# Patient Record
Sex: Male | Born: 2000 | Race: Black or African American | Hispanic: No | Marital: Single | Smoking: Never smoker
Health system: Southern US, Community
[De-identification: ages and names within clinical notes are randomized; demographics above are authoritative.]

## PROBLEM LIST (undated history)

## (undated) DIAGNOSIS — F39 Unspecified mood [affective] disorder: Secondary | ICD-10-CM

## (undated) DIAGNOSIS — J45909 Unspecified asthma, uncomplicated: Secondary | ICD-10-CM

## (undated) HISTORY — PX: ADENOIDECTOMY: SUR15

## (undated) HISTORY — PX: TONSILLECTOMY: SUR1361

---
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
## 2021-03-10 ENCOUNTER — Encounter (HOSPITAL_COMMUNITY): Payer: Self-pay

## 2021-03-10 ENCOUNTER — Ambulatory Visit (HOSPITAL_COMMUNITY)
Admission: EM | Admit: 2021-03-10 | Discharge: 2021-03-10 | Disposition: A | Discharge status: Home / Self Care | Payer: Medicaid Other | Attending: Physician Assistant | Admitting: Physician Assistant

## 2021-03-10 ENCOUNTER — Other Ambulatory Visit: Payer: Self-pay

## 2021-03-10 DIAGNOSIS — M546 Pain in thoracic spine: Secondary | ICD-10-CM | POA: Diagnosis not present

## 2021-03-10 DIAGNOSIS — W19XXXA Unspecified fall, initial encounter: Secondary | ICD-10-CM | POA: Diagnosis not present

## 2021-03-10 DIAGNOSIS — R0781 Pleurodynia: Secondary | ICD-10-CM | POA: Diagnosis not present

## 2021-03-10 MED ORDER — TIZANIDINE HCL 4 MG PO CAPS: 4.0000 mg | ORAL_CAPSULE | Freq: Three times a day (TID) | ORAL | 0 refills | Status: DC

## 2021-03-10 MED ORDER — NAPROXEN 500 MG PO TABS: 500.0000 mg | ORAL_TABLET | Freq: Two times a day (BID) | ORAL | 0 refills | Status: DC

## 2021-03-10 NOTE — Discharge Instructions (Signed)
Take Naprosyn twice daily with food.  You should not take NSAIDs (ibuprofen/Advil, naproxen/Aleve, aspirin) with this medication due to risk of GI bleeding.  Take Zanaflex up to 3 times a day as needed for muscle pain.  This medication can make you sleepy so do not drive or drink alcohol while taking it.  You can use heat and stretch for symptom relief.  If you have persistent or worsening symptoms please return for reevaluation.

## 2021-03-10 NOTE — ED Provider Notes (Signed)
[facility]    CSN: [id number] Arrival date & time: 03/10/21  1647      History   Chief Complaint Chief Complaint  Patient presents with  . Fall  . Back Pain    HPI Jason Fuller is a 20 y.o. male.   Patient presents today for evaluation of back and rib pain following injury yesterday.  Reports that he went to dunk a ball when he twisted wrong and fell.  He did not impact his rib/back but felt an unusual sensation with a twisting motion.  Since that time he has had persistent pain which is currently rated 3 on a surgical pain scale but increases to 8 at night, localized to right thoracic back with radiation laterally to ribs, described as aching any factors identified.  He has tried Tylenol without improvement of symptoms.  Denies any numbness or paresthesias.  Denies any shortness of breath, chest pain, nausea, vomiting.  He denied his head with the fall and denies any loss of consciousness.  He was able to move out of his apartment and has been able to do physical activities despite symptoms.  No additional complaints or concerns today.     Past Medical History:  Diagnosis Date  . Asthma   . Attention deficit disorder   . Mood disorder [facility])     Patient Active Problem List   Diagnosis Date Noted  . Attention deficit disorder     Past Surgical History:  Procedure Laterality Date  . ADENOIDECTOMY    . TONSILLECTOMY         Home Medications    Prior to Admission medications   Medication Sig Start Date End Date Taking? Authorizing Provider  naproxen (NAPROSYN) 500 MG tablet Take 1 tablet (500 mg total) by mouth 2 (two) times daily. 03/10/21  Yes Kholton Coate K, PA-C  tiZANidine (ZANAFLEX) 4 MG capsule Take 1 capsule (4 mg total) by mouth 3 (three) times daily. 03/10/21  Yes Marshe Shrestha, Noberto Retort, PA-C  acetaminophen (TYLENOL) 325 MG tablet Take 650 mg by mouth every 6 (six) hours as needed for mild pain.    [provider]  cetirizine (ZYRTEC) 10 MG tablet  Take 1 tablet (10 mg total) by mouth at bedtime. 11/28/19   Lowanda Foster, NP  fluticasone (FLONASE) 50 MCG/ACT nasal spray Place 2 sprays into both nostrils daily. Patient not taking: Reported on 08/19/2018 10/20/13   Ivonne Andrew, PA-C  guaiFENesin (MUCINEX) 600 MG 12 hr tablet Take 1 tablet (600 mg total) by mouth 2 (two) times daily. Patient not taking: Reported on 08/19/2018 10/20/13   Ivonne Andrew, PA-C    Family History History reviewed. No pertinent family history.  Social History Social History   Tobacco Use  . Smoking status: Never Smoker  . Smokeless tobacco: Never Used     Allergies   Patient has no known allergies.   Review of Systems Review of Systems  Constitutional: Positive for activity change. Negative for appetite change, fatigue and fever.  Respiratory: Negative for cough and shortness of breath.   Cardiovascular: Negative for chest pain.  Gastrointestinal: Negative for abdominal pain, diarrhea, nausea and vomiting.  Musculoskeletal: Positive for back pain. Negative for arthralgias and myalgias.  Neurological: Negative for dizziness, light-headedness and headaches.     Physical Exam Triage Vital Signs ED Triage Vitals  Enc Vitals Group     BP 03/10/21 1751 136/69     Pulse Rate 03/10/21 1751 (!) 54     Resp 03/10/21 1751  18     Temp 03/10/21 1751 98.2 F (36.8 C)     Temp src --      SpO2 03/10/21 1751 98 %     Weight --      Height --      Head Circumference --      Peak Flow --      Pain Score 03/10/21 1749 7     Pain Loc --      Pain Edu? --      Excl. in GC? --    No data found.  Updated Vital Signs BP 136/69   Pulse (!) 54   Temp 98.2 F (36.8 C)   Resp 18   SpO2 98%   Visual Acuity Right Eye Distance:   Left Eye Distance:   Bilateral Distance:    Right Eye Near:   Left Eye Near:    Bilateral Near:     Physical Exam Vitals reviewed.  Constitutional:      General: He is awake.     Appearance: Normal appearance. He  is normal weight. He is not ill-appearing.     Comments: Very pleasant male appears stated age in no acute distress  HENT:     Head: Normocephalic and atraumatic.     Mouth/Throat:     Pharynx: No oropharyngeal exudate, posterior oropharyngeal erythema or uvula swelling.  Cardiovascular:     Rate and Rhythm: Normal rate and regular rhythm.     Heart sounds: No murmur heard.   Pulmonary:     Effort: Pulmonary effort is normal.     Breath sounds: Normal breath sounds. No stridor. No wheezing, rhonchi or rales.     Comments: Clear to auscultation bilaterally Abdominal:     Palpations: Abdomen is soft.     Tenderness: There is no abdominal tenderness.  Musculoskeletal:     Cervical back: No tenderness or bony tenderness.     Thoracic back: Tenderness present. No bony tenderness.     Lumbar back: No tenderness or bony tenderness. Negative right straight leg raise test and negative left straight leg raise test.     Comments: Back: No pain percussion of vertebrae.  Tenderness palpation of right thoracic paraspinal muscles and right lateral ribs.  No spasms noted.  Negative straight leg raise bilaterally.  Neurological:     Mental Status: He is alert.  Psychiatric:        Behavior: Behavior is cooperative.      UC Treatments / Results  Labs (all labs ordered are listed, but only abnormal results are displayed) Labs Reviewed - No data to display  EKG   Radiology No results found.  Procedures Procedures (including critical care time)  Medications Ordered in UC Medications - No data to display  Initial Impression / Assessment and Plan / UC Course  I have reviewed the triage vital signs and the nursing notes.  Pertinent labs & imaging results that were available during my care of the patient were reviewed by me and considered in my medical decision making (see chart for details).     No bony tenderness or deformity that would warrant plain films.  Suspect muscle strain  given clinical presentation.  Patient started on Naprosyn with instruction not to take NSAIDs with this medication due to risk of GI bleeding.  He was prescribed Zanaflex to be used up to 3 times a day with instruction not to drive or drink alcohol with this medication as drowsiness is a common side effect.  He was given work excuse note.  Recommended heat and rest and stretch for symptom relief.  Strict return precautions given to which patient expressed understanding.  Final Clinical Impressions(s) / UC Diagnoses   Final diagnoses:  Acute right-sided thoracic back pain  Rib pain on right side  Fall, initial encounter     Discharge Instructions     Take Naprosyn twice daily with food.  You should not take NSAIDs (ibuprofen/Advil, naproxen/Aleve, aspirin) with this medication due to risk of GI bleeding.  Take Zanaflex up to 3 times a day as needed for muscle pain.  This medication can make you sleepy so do not drive or drink alcohol while taking it.  You can use heat and stretch for symptom relief.  If you have persistent or worsening symptoms please return for reevaluation.    ED Prescriptions    Medication Sig Dispense Auth. Provider   naproxen (NAPROSYN) 500 MG tablet Take 1 tablet (500 mg total) by mouth 2 (two) times daily. 30 tablet Blayne Garlick K, PA-C   tiZANidine (ZANAFLEX) 4 MG capsule Take 1 capsule (4 mg total) by mouth 3 (three) times daily. 21 capsule Vasiliki Smaldone K, PA-C     PDMP not reviewed this encounter.   Jeani Hawking, PA-C 03/10/21 1850

## 2021-03-10 NOTE — ED Triage Notes (Signed)
Pt in with c/o right upper back pain that started yesterday after he fell while playing basketball   Pt states his back did not hit the ground but he twisted wrong when he fell  Pt took tylenol for pain

## 2021-07-23 ENCOUNTER — Ambulatory Visit (HOSPITAL_COMMUNITY)
Admission: EM | Admit: 2021-07-23 | Discharge: 2021-07-23 | Disposition: A | Discharge status: Home / Self Care | Payer: Medicaid Other | Attending: Emergency Medicine | Admitting: Emergency Medicine

## 2021-07-23 ENCOUNTER — Encounter (HOSPITAL_COMMUNITY): Payer: Self-pay

## 2021-07-23 ENCOUNTER — Ambulatory Visit (INDEPENDENT_AMBULATORY_CARE_PROVIDER_SITE_OTHER): Payer: Medicaid Other

## 2021-07-23 DIAGNOSIS — Y9352 Activity, horseback riding: Secondary | ICD-10-CM | POA: Diagnosis not present

## 2021-07-23 DIAGNOSIS — J069 Acute upper respiratory infection, unspecified: Secondary | ICD-10-CM | POA: Insufficient documentation

## 2021-07-23 DIAGNOSIS — M25561 Pain in right knee: Secondary | ICD-10-CM | POA: Insufficient documentation

## 2021-07-23 LAB — POCT RAPID STREP A, ED / UC: Streptococcus, Group A Screen (Direct): NEGATIVE

## 2021-07-23 NOTE — Discharge Instructions (Signed)
  You may take 500mg  acetaminophen every 4-6 hours or in combination with ibuprofen 400-600mg  every 6-8 hours as needed for pain, inflammation, and fever.  Be sure to well hydrated with clear liquids and get at least 8 hours of sleep at night, preferably more while sick.   Please follow up with family medicine in 1 week if needed.   You may use the knee brace for comfort, elevate your leg 2-3 times daily to apply a cool compress. This will help with pain and inflammation. Call to schedule a follow up appointment with Sports Medicine later this week as needed.

## 2021-07-23 NOTE — ED Provider Notes (Signed)
[facility]    CSN: [id number] Arrival date & time: 07/23/21  1455      History   Chief Complaint Chief Complaint  Patient presents with   URI   Knee Injury    HPI Jason Fuller is a 20 y.o. male.   HPI Jason Fuller is a 20 y.o. male presenting to UC with c/o sore throat and non-productive cough with chills for 6 days, following a day at the fair with some friends. His friends, who are also roommates, also got sick but no known exposure to COVID or flu.  Denies fever, n/v/d. No chest pain or SOB. He is able to eat and drink well.  Pt also c/o Right knee pain that is aching and sore at the top and back of his knee. Pain started yesterday, fell off a horse yesterday and landed with his kneed straight.  Pain is 6/10, worse with flexion.     Past Medical History:  Diagnosis Date   Asthma    Attention deficit disorder    Mood disorder [facility])     Patient Active Problem List   Diagnosis Date Noted   Attention deficit disorder     Past Surgical History:  Procedure Laterality Date   ADENOIDECTOMY     TONSILLECTOMY         Home Medications    Prior to Admission medications   Medication Sig Start Date End Date Taking? Authorizing Provider  acetaminophen (TYLENOL) 325 MG tablet Take 650 mg by mouth every 6 (six) hours as needed for mild pain.    [provider]  cetirizine (ZYRTEC) 10 MG tablet Take 1 tablet (10 mg total) by mouth at bedtime. 11/28/19   Lowanda Foster, NP  fluticasone (FLONASE) 50 MCG/ACT nasal spray Place 2 sprays into both nostrils daily. Patient not taking: Reported on 08/19/2018 10/20/13   Ivonne Andrew, PA-C  guaiFENesin (MUCINEX) 600 MG 12 hr tablet Take 1 tablet (600 mg total) by mouth 2 (two) times daily. Patient not taking: Reported on 08/19/2018 10/20/13   Ivonne Andrew, PA-C  naproxen (NAPROSYN) 500 MG tablet Take 1 tablet (500 mg total) by mouth 2 (two) times daily. 03/10/21   Raspet, Noberto Retort, PA-C  tiZANidine (ZANAFLEX) 4  MG capsule Take 1 capsule (4 mg total) by mouth 3 (three) times daily. 03/10/21   Raspet, Noberto Retort, PA-C    Family History Family History  Family history unknown: Yes    Social History Social History   Tobacco Use   Smoking status: Never   Smokeless tobacco: Never     Allergies   Patient has no known allergies.   Review of Systems Review of Systems  Constitutional:  Negative for chills and fever.  HENT:  Positive for congestion and sore throat. Negative for ear pain, trouble swallowing and voice change.   Respiratory:  Positive for cough. Negative for shortness of breath.   Cardiovascular:  Negative for chest pain and palpitations.  Gastrointestinal:  Negative for abdominal pain, diarrhea, nausea and vomiting.  Musculoskeletal:  Positive for arthralgias. Negative for back pain, joint swelling and myalgias.  Skin:  Negative for rash.  All other systems reviewed and are negative.   Physical Exam Triage Vital Signs ED Triage Vitals [07/23/21 1536]  Enc Vitals Group     BP      Pulse      Resp      Temp      Temp src      SpO2  Weight      Height      Head Circumference      Peak Flow      Pain Score 6     Pain Loc      Pain Edu?      Excl. in GC?    No data found.  Updated Vital Signs BP (!) 142/55 (BP Location: Left Arm)   Pulse 61   Temp 98.7 F (37.1 C) (Oral)   Resp 18   SpO2 99%   Visual Acuity Right Eye Distance:   Left Eye Distance:   Bilateral Distance:    Right Eye Near:   Left Eye Near:    Bilateral Near:     Physical Exam Vitals and nursing note reviewed.  Constitutional:      General: He is not in acute distress.    Appearance: Normal appearance. He is well-developed. He is not ill-appearing, toxic-appearing or diaphoretic.  HENT:     Head: Normocephalic and atraumatic.     Right Ear: Tympanic membrane and ear canal normal.     Left Ear: Tympanic membrane and ear canal normal.     Nose: Nose normal.     Right Sinus: No  maxillary sinus tenderness or frontal sinus tenderness.     Left Sinus: No maxillary sinus tenderness or frontal sinus tenderness.     Mouth/Throat:     Lips: Pink.     Mouth: Mucous membranes are moist.     Pharynx: Oropharynx is clear. Uvula midline. Posterior oropharyngeal erythema present. No pharyngeal swelling, oropharyngeal exudate or uvula swelling.     Tonsils: No tonsillar exudate or tonsillar abscesses. 2+ on the right. 2+ on the left.  Cardiovascular:     Rate and Rhythm: Normal rate and regular rhythm.  Pulmonary:     Effort: Pulmonary effort is normal. No respiratory distress.     Breath sounds: Normal breath sounds. No stridor. No wheezing, rhonchi or rales.  Musculoskeletal:        General: Swelling and tenderness present. Normal range of motion.     Cervical back: Normal range of motion and neck supple.     Comments: Right knee: minimal edema to superior aspect, tender. No obvious deformity. Full ROM, increased pain on end range.  Calf is soft, non-tender.  Able to ambulate without assistance.   Lymphadenopathy:     Cervical: No cervical adenopathy.  Skin:    General: Skin is warm and dry.  Neurological:     Mental Status: He is alert and oriented to person, place, and time.  Psychiatric:        Behavior: Behavior normal.     UC Treatments / Results  Labs (all labs ordered are listed, but only abnormal results are displayed) Labs Reviewed  CULTURE, GROUP A STREP [facility])  POCT RAPID STREP A, ED / UC    EKG   Radiology DG Knee Complete 4 Views Right  Result Date: 07/23/2021 CLINICAL DATA:  Pain after falling off horse yesterday. EXAM: RIGHT KNEE - COMPLETE 4+ VIEW COMPARISON:  None. FINDINGS: No evidence of fracture, dislocation, or joint effusion. No evidence of arthropathy or other focal bone abnormality. Soft tissues are unremarkable. IMPRESSION: Negative. Electronically Signed   By: Gerome Sam III M.D.   On: 07/23/2021 15:56     Procedures Procedures (including critical care time)  Medications Ordered in UC Medications - No data to display  Initial Impression / Assessment and Plan / UC Course  I have reviewed the  triage vital signs and the nursing notes.  Pertinent labs & imaging results that were available during my care of the patient were reviewed by me and considered in my medical decision making (see chart for details).     Rapid strep: NEGATIVE Culture sent COVID test declined at this time.  Discussed Right knee imaging with pt.  Offered knee brace, pt declined. Encouraged RICE home tx and f/u with sports medicine.  AVS given.   Final Clinical Impressions(s) / UC Diagnoses   Final diagnoses:  Upper respiratory tract infection, unspecified type  Acute pain of right knee     Discharge Instructions       You may take 500mg  acetaminophen every 4-6 hours or in combination with ibuprofen 400-600mg  every 6-8 hours as needed for pain, inflammation, and fever.  Be sure to well hydrated with clear liquids and get at least 8 hours of sleep at night, preferably more while sick.   Please follow up with family medicine in 1 week if needed.   You may use the knee brace for comfort, elevate your leg 2-3 times daily to apply a cool compress. This will help with pain and inflammation. Call to schedule a follow up appointment with Sports Medicine later this week as needed.     ED Prescriptions   None    PDMP not reviewed this encounter.   , Lurene Shadow 07/23/21 1819

## 2021-07-23 NOTE — ED Triage Notes (Signed)
Pt presents with slight sore throat, non productive cough and chills X 6 days.   Pt also complains of right knee pain after falling off a horse yesterday.

## 2021-07-26 LAB — CULTURE, GROUP A STREP (THRC)

## 2021-09-03 ENCOUNTER — Ambulatory Visit (INDEPENDENT_AMBULATORY_CARE_PROVIDER_SITE_OTHER): Payer: Medicaid Other

## 2021-09-03 ENCOUNTER — Encounter (HOSPITAL_COMMUNITY): Payer: Self-pay | Admitting: Emergency Medicine

## 2021-09-03 ENCOUNTER — Ambulatory Visit (HOSPITAL_COMMUNITY)
Admission: EM | Admit: 2021-09-03 | Discharge: 2021-09-03 | Disposition: A | Discharge status: Home / Self Care | Payer: Medicaid Other | Attending: Emergency Medicine | Admitting: Emergency Medicine

## 2021-09-03 ENCOUNTER — Other Ambulatory Visit: Payer: Self-pay

## 2021-09-03 DIAGNOSIS — S5001XA Contusion of right elbow, initial encounter: Secondary | ICD-10-CM | POA: Diagnosis not present

## 2021-09-03 DIAGNOSIS — M25521 Pain in right elbow: Secondary | ICD-10-CM | POA: Diagnosis not present

## 2021-09-03 DIAGNOSIS — W19XXXA Unspecified fall, initial encounter: Secondary | ICD-10-CM | POA: Diagnosis not present

## 2021-09-03 NOTE — ED Triage Notes (Signed)
Fell on right elbow 2 weeks ago playing basketball has knot below elbow.

## 2021-09-03 NOTE — Discharge Instructions (Signed)
You can take Tylenol and or ibuprofen as needed for pain relief and fever reduction. You can use an Ace bandage or arm splint as needed for comfort.   Rest as much as possible Ice for 10-15 minutes every 4-6 hours as needed for pain and swelling Compression- use an ace bandage or splint for comfort Elevate above your hip/heart when sitting and laying down  Follow up with sports medicine or orthopedics if symptoms do not improve in the next few weeks.

## 2021-09-03 NOTE — ED Provider Notes (Signed)
[facility]    CSN: [id number] Arrival date & time: 09/03/21  1341      History   Chief Complaint Chief Complaint  Patient presents with   Elbow Pain    HPI Jason Fuller is a 20 y.o. male.   Patient here for evaluation of right elbow pain that has been ongoing for the past week or 2.  Reports pain started after falling on elbow while playing basketball.  Reports pain with movement and touch.  Patient does have full range of motion.  Reports swelling to right elbow.  Denies any numbness or tingling in his right hand.  Denies any fevers, chest pain, shortness of breath, N/V/D, numbness, tingling, weakness, abdominal pain, or headaches.    The history is provided by the patient.   Past Medical History:  Diagnosis Date   Asthma    Attention deficit disorder    Mood disorder [facility])     Patient Active Problem List   Diagnosis Date Noted   Attention deficit disorder     Past Surgical History:  Procedure Laterality Date   ADENOIDECTOMY     TONSILLECTOMY         Home Medications    Prior to Admission medications   Medication Sig Start Date End Date Taking? Authorizing Provider  acetaminophen (TYLENOL) 325 MG tablet Take 650 mg by mouth every 6 (six) hours as needed for mild pain.    [provider]  cetirizine (ZYRTEC) 10 MG tablet Take 1 tablet (10 mg total) by mouth at bedtime. 11/28/19   Lowanda Foster, NP  fluticasone (FLONASE) 50 MCG/ACT nasal spray Place 2 sprays into both nostrils daily. Patient not taking: Reported on 08/19/2018 10/20/13   Ivonne Andrew, PA-C  guaiFENesin (MUCINEX) 600 MG 12 hr tablet Take 1 tablet (600 mg total) by mouth 2 (two) times daily. Patient not taking: Reported on 08/19/2018 10/20/13   Ivonne Andrew, PA-C  naproxen (NAPROSYN) 500 MG tablet Take 1 tablet (500 mg total) by mouth 2 (two) times daily. 03/10/21   Raspet, Noberto Retort, PA-C  tiZANidine (ZANAFLEX) 4 MG capsule Take 1 capsule (4 mg total) by mouth 3 (three) times  daily. 03/10/21   Raspet, Noberto Retort, PA-C    Family History Family History  Family history unknown: Yes    Social History Social History   Tobacco Use   Smoking status: Never   Smokeless tobacco: Never     Allergies   Patient has no known allergies.   Review of Systems Review of Systems  Musculoskeletal:  Positive for arthralgias and joint swelling.  All other systems reviewed and are negative.   Physical Exam Triage Vital Signs ED Triage Vitals  Enc Vitals Group     BP 09/03/21 1457 (!) 134/92     Pulse Rate 09/03/21 1457 (!) 55     Resp 09/03/21 1457 16     Temp 09/03/21 1457 98.6 F (37 C)     Temp Source 09/03/21 1457 Oral     SpO2 09/03/21 1457 97 %     Weight --      Height --      Head Circumference --      Peak Flow --      Pain Score 09/03/21 1456 4     Pain Loc --      Pain Edu? --      Excl. in GC? --    No data found.  Updated Vital Signs BP (!) 134/92 (BP Location: Right Arm)  Pulse (!) 55   Temp 98.6 F (37 C) (Oral)   Resp 16   SpO2 97%   Visual Acuity Right Eye Distance:   Left Eye Distance:   Bilateral Distance:    Right Eye Near:   Left Eye Near:    Bilateral Near:     Physical Exam Vitals and nursing note reviewed.  Constitutional:      General: He is not in acute distress.    Appearance: Normal appearance. He is not ill-appearing, toxic-appearing or diaphoretic.  HENT:     Head: Normocephalic and atraumatic.  Eyes:     Conjunctiva/sclera: Conjunctivae normal.  Cardiovascular:     Rate and Rhythm: Normal rate and regular rhythm.     Pulses: Normal pulses.     Heart sounds: Normal heart sounds.  Pulmonary:     Effort: Pulmonary effort is normal.     Breath sounds: Normal breath sounds.  Abdominal:     General: Abdomen is flat.  Musculoskeletal:        General: Normal range of motion.     Right elbow: Swelling present. Normal range of motion. Tenderness present in olecranon process.     Cervical back: Normal range of  motion.  Skin:    General: Skin is warm and dry.  Neurological:     General: No focal deficit present.     Mental Status: He is alert and oriented to person, place, and time.  Psychiatric:        Mood and Affect: Mood normal.     UC Treatments / Results  Labs (all labs ordered are listed, but only abnormal results are displayed) Labs Reviewed - No data to display  EKG   Radiology DG Elbow Complete Right  Result Date: 09/03/2021 CLINICAL DATA:  Right elbow pain and swelling after fall 2 weeks ago. EXAM: RIGHT ELBOW - COMPLETE 3+ VIEW COMPARISON:  None. FINDINGS: There is no evidence of fracture, dislocation, or joint effusion. There is no evidence of arthropathy or other focal bone abnormality. Soft tissues are unremarkable. IMPRESSION: Negative. Electronically Signed   By: Lupita Raider M.D.   On: 09/03/2021 15:32    Procedures Procedures (including critical care time)  Medications Ordered in UC Medications - No data to display  Initial Impression / Assessment and Plan / UC Course  I have reviewed the triage vital signs and the nursing notes.  Pertinent labs & imaging results that were available during my care of the patient were reviewed by me and considered in my medical decision making (see chart for details).    Assessment negative for red flags or concerns.  X-ray with no acute bony abnormality.  This is likely an elbow contusion.  May take Tylenol and or ibuprofen as needed.  May use Ace bandage or arm splint as needed for comfort.  Recommend rest, ice, compression, and elevation.  Follow-up with orthopedics if symptoms do not improve in the next few weeks. Final Clinical Impressions(s) / UC Diagnoses   Final diagnoses:  Contusion of right elbow, initial encounter     Discharge Instructions      You can take Tylenol and or ibuprofen as needed for pain relief and fever reduction. You can use an Ace bandage or arm splint as needed for comfort.   Rest as much  as possible Ice for 10-15 minutes every 4-6 hours as needed for pain and swelling Compression- use an ace bandage or splint for comfort Elevate above your hip/heart when sitting and  laying down  Follow up with sports medicine or orthopedics if symptoms do not improve in the next few weeks.      ED Prescriptions   None    PDMP not reviewed this encounter.   Ivette Loyal, NP 09/03/21 1550

## 2022-04-14 ENCOUNTER — Emergency Department (HOSPITAL_COMMUNITY): Payer: Medicaid Other

## 2022-04-14 ENCOUNTER — Emergency Department (HOSPITAL_COMMUNITY)
Admission: EM | Admit: 2022-04-14 | Discharge: 2022-04-14 | Disposition: A | Discharge status: Home / Self Care | Payer: Medicaid Other | Attending: Emergency Medicine | Admitting: Emergency Medicine

## 2022-04-14 ENCOUNTER — Other Ambulatory Visit: Payer: Self-pay

## 2022-04-14 ENCOUNTER — Encounter (HOSPITAL_COMMUNITY): Payer: Self-pay

## 2022-04-14 DIAGNOSIS — S61411A Laceration without foreign body of right hand, initial encounter: Secondary | ICD-10-CM | POA: Insufficient documentation

## 2022-04-14 DIAGNOSIS — S0990XA Unspecified injury of head, initial encounter: Secondary | ICD-10-CM | POA: Insufficient documentation

## 2022-04-14 DIAGNOSIS — S63681A Other sprain of right thumb, initial encounter: Secondary | ICD-10-CM | POA: Diagnosis not present

## 2022-04-14 DIAGNOSIS — S81811A Laceration without foreign body, right lower leg, initial encounter: Secondary | ICD-10-CM | POA: Diagnosis not present

## 2022-04-14 DIAGNOSIS — S6991XA Unspecified injury of right wrist, hand and finger(s), initial encounter: Secondary | ICD-10-CM | POA: Diagnosis present

## 2022-04-14 MED ORDER — IBUPROFEN 800 MG PO TABS
800.0000 mg | ORAL_TABLET | Freq: Once | ORAL | Status: AC
  Administered 2022-04-14: 800 mg via ORAL
  Filled 2022-04-14: qty 1

## 2022-04-14 MED ORDER — IBUPROFEN 800 MG PO TABS: 800.0000 mg | ORAL_TABLET | Freq: Three times a day (TID) | ORAL | 0 refills | Status: AC

## 2022-04-14 MED ORDER — LIDOCAINE-EPINEPHRINE (PF) 2 %-1:200000 IJ SOLN
10.0000 mL | Freq: Once | INTRAMUSCULAR | Status: AC
  Administered 2022-04-14: 10 mL
  Filled 2022-04-14: qty 20

## 2022-04-14 MED ORDER — ACETAMINOPHEN 500 MG PO TABS
1000.0000 mg | ORAL_TABLET | Freq: Once | ORAL | Status: AC
  Administered 2022-04-14: 1000 mg via ORAL
  Filled 2022-04-14: qty 2
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
## 2022-04-14 NOTE — Discharge Instructions (Addendum)
1.  Schedule follow-up appointment with a hand specialist, Dr. Lenon Curt.  Contact information is included in your discharge instructions.  You need a recheck of your thumb to determine how badly sprained it is.  Some thumb sprains require surgical treatment.  Prior to wear your splint is much as possible.  You may remove it for cleaning your wounds and washing. 2.  Your stitches should come out in approximately 7 days you may follow-up with your family doctor for removal of stitches or return to the emergency department or urgent care.  I recommend a follow-up appointment with your family doctor to recheck all of your injuries in approximately a week.  You may be more sore and stiff in the next 2 to 5 days. 3.  Take ibuprofen 800 mg every 8 hours for pain.  You may also take extra strength Tylenol every 6 hours in addition to ibuprofen if needed.

## 2022-10-16 ENCOUNTER — Ambulatory Visit (INDEPENDENT_AMBULATORY_CARE_PROVIDER_SITE_OTHER): Payer: Medicaid Other | Admitting: Internal Medicine

## 2022-10-16 ENCOUNTER — Other Ambulatory Visit: Payer: Self-pay

## 2022-10-16 VITALS — BP 122/60 | HR 62 | Temp 98.0°F | Resp 16 | Ht 65.5 in | Wt 187.3 lb

## 2022-10-16 DIAGNOSIS — H1013 Acute atopic conjunctivitis, bilateral: Secondary | ICD-10-CM

## 2022-10-16 DIAGNOSIS — J3089 Other allergic rhinitis: Secondary | ICD-10-CM

## 2022-10-16 DIAGNOSIS — J4520 Mild intermittent asthma, uncomplicated: Secondary | ICD-10-CM

## 2022-10-16 DIAGNOSIS — L858 Other specified epidermal thickening: Secondary | ICD-10-CM

## 2022-10-16 MED ORDER — AZELASTINE HCL 0.1 % NA SOLN: 1.0000 | Freq: Two times a day (BID) | NASAL | 5 refills | Status: AC | PRN

## 2022-10-16 MED ORDER — VENTOLIN HFA 108 (90 BASE) MCG/ACT IN AERS: 1.0000 | INHALATION_SPRAY | RESPIRATORY_TRACT | 1 refills | Status: AC | PRN

## 2022-10-16 MED ORDER — FLUTICASONE PROPIONATE 50 MCG/ACT NA SUSP: 2.0000 | Freq: Every day | NASAL | 5 refills | Status: AC

## 2022-10-16 MED ORDER — CETIRIZINE HCL 10 MG PO TABS: 10.0000 mg | ORAL_TABLET | Freq: Every day | ORAL | 5 refills | Status: AC | PRN

## 2022-10-16 NOTE — Progress Notes (Signed)
NEW PATIENT  Date of Service/Encounter:  10/16/22  Consult requested by: Loma Messing, MD   Subjective:   Jason Fuller (DOB: [birth year]) is a 21 y.o. male who presents to the clinic on 10/16/2022 with a chief complaint of Allergy Testing (Environmental:/Food: No Hx), Eczema, and Asthma .    History obtained from: chart review and patient.   Asthma:  Diagnosed around 5th grade.   He sometimes has shortness of breath with exercise or cold weather.  About 1-2 days daytime symptoms in past month, 0 nighttime awakenings in past month Using rescue inhaler about 2x/month or less Limitations to daily activity: none 0 ED visits, 0 UC visits and 0 oral steroids in the past year 0 number of lifetime hospitalizations, 0 number of lifetime intubations.  Identified Triggers: cold air Prior PFTs or spirometry: none Current regimen:  Maintenance: none Rescue: Albuterol 2 puffs q4-6 hrs PRN  Rhinitis:  Started in childhood.  Symptoms include: nasal congestion, rhinorrhea, post nasal drainage, sneezing, watery eyes, and itchy eyes  Occurs year-round Potential triggers: dust, pollen Treatments tried:  Zyrtec PRN; last use was in Sept 2023  Previous allergy testing: yes in childhood but can't recall exact results History of reflux/heartburn: no History of chronic sinusitis or sinus surgery: deviated septum, surgery in 12/2021.   Atopic Dermatitis:  He has a history of eczema.  He has not had flare ups.  He does sometimes get itchy bumpy skin. He does not moisturize enough.  Past Medical History: Past Medical History:  Diagnosis Date   Asthma    Attention deficit disorder    Mood disorder [facility])    Past Surgical History: Past Surgical History:  Procedure Laterality Date   ADENOIDECTOMY     TONSILLECTOMY      Family History: Family History  Family history unknown: Yes    Social History:  Lives in a 40 year house Flooring in bedroom: carpet Pets: none Tobacco  use/exposure: none Job: Chief Technology Officer  Medication List:  Allergies as of 10/16/2022   No Known Allergies      Medication List        Accurate as of October 16, 2022  4:07 PM. If you have any questions, ask your nurse or doctor.          azelastine 0.1 % nasal spray Commonly known as: ASTELIN Place 1 spray into both nostrils 2 (two) times daily as needed for rhinitis. Use in each nostril as directed Started by: Birder Robson, MD   cetirizine 10 MG tablet Commonly known as: ZYRTEC Take 1 tablet (10 mg total) by mouth daily as needed for allergies. What changed:  when to take this reasons to take this Changed by: Birder Robson, MD   clobetasol 0.05 % external solution Commonly known as: TEMOVATE Apply 1 Application topically 2 (two) times daily.   Derma-Smoothe/FS Body 0.01 % Oil Generic drug: Fluocinolone Acetonide Body Apply 0.01 % topically as directed.   desonide 0.05 % cream Commonly known as: DESOWEN Apply 1 Application topically 2 (two) times daily as needed.   fluticasone 50 MCG/ACT nasal spray Commonly known as: FLONASE Place 2 sprays into both nostrils daily. Started by: Birder Robson, MD   ibuprofen 800 MG tablet Commonly known as: ADVIL Take 1 tablet (800 mg total) by mouth 3 (three) times daily.   ketoconazole 2 % shampoo Commonly known as: NIZORAL Apply 1 Application topically 2 (two) times a week.   Ventolin HFA 108 (90 Base) MCG/ACT inhaler Generic  drug: albuterol Inhale 1-2 puffs into the lungs as needed.         REVIEW OF SYSTEMS: Pertinent positives and negatives discussed in HPI.   Objective:   Physical Exam: BP 122/60   Pulse 62   Temp 98 F (36.7 C)   Resp 16   Ht 5' 5.5" (1.664 m)   Wt 187 lb 4.8 oz (85 kg)   SpO2 97%   BMI 30.69 kg/m  Body mass index is 30.69 kg/m. GEN: alert, well developed HEENT: clear conjunctiva, TM grey and translucent, nose with + inferior turbinate hypertrophy, pink nasal mucosa, slight  clear rhinorrhea, no cobblestoning HEART: regular rate and rhythm, no murmur LUNGS: clear to auscultation bilaterally, no coughing, unlabored respiration ABDOMEN: soft, non distended  SKIN: rough skin on bilateral arms, no eczema patches  Reviewed:  No records  Spirometry:  Tracings reviewed. His effort: Good reproducible efforts. FVC: 3.75L FEV1: 3.20L, 92% predicted FEV1/FVC ratio: 85% Interpretation: Spirometry consistent with normal pattern.  Please see scanned spirometry results for details.  Skin Testing:  Skin prick testing was placed, which includes aeroallergens/foods, histamine control, and saline control.  Verbal consent was obtained prior to placing test.  Patient tolerated procedure well.  Allergy testing results were read and interpreted by myself, documented by clinical staff. Adequate positive and negative control.  Results discussed with patient/family.  Airborne Adult Perc - 10/16/22 1428     Time Antigen Placed 1428    Allergen Manufacturer Greer    Location Back    Number of Test 59    Panel 1 Select    1. Control-Buffer 50% Glycerol Negative    2. Control-Histamine 1 mg/ml 3+    3. Albumin saline Negative    4. Bahia Negative    5. French Southern Territories Negative    6. Johnson Negative    7. Kentucky Blue Negative    8. Meadow Fescue Negative    9. Perennial Rye Negative    10. Sweet Vernal Negative    11. Timothy Negative    12. Cocklebur Negative    13. Burweed Marshelder Negative    14. Ragweed, short Negative    15. Ragweed, Giant Negative    16. Plantain,  English Negative    17. Lamb's Quarters Negative    18. Sheep Sorrell Negative    19. Rough Pigweed Negative    20. Marsh Elder, Rough Negative    21. Mugwort, Common Negative    22. Ash mix Negative    23. Birch mix Negative    24. Beech American Negative    25. Box, Elder Negative    26. Cedar, red Negative    27. Cottonwood, Guinea-Bissau Negative    28. Elm mix Negative    29. Hickory Negative     30. Maple mix Negative    31. Oak, Guinea-Bissau mix Negative    32. Pecan Pollen Negative    33. Pine mix Negative    34. Sycamore Eastern Negative    35. Walnut, Black Pollen Negative    36. Alternaria alternata Negative    37. Cladosporium Herbarum Negative    38. Aspergillus mix Negative    39. Penicillium mix Negative    40. Bipolaris sorokiniana (Helminthosporium) 3+    41. Drechslera spicifera (Curvularia) 3+    42. Mucor plumbeus Negative    43. Fusarium moniliforme 3+    44. Aureobasidium pullulans (pullulara) Negative    45. Rhizopus oryzae Negative    46. Botrytis cinera Negative  47. Epicoccum nigrum Negative    48. Phoma betae 3+    49. Candida Albicans 3+    50. Trichophyton mentagrophytes 3+    51. Mite, D Farinae  5,000 AU/ml 3+    52. Mite, D Pteronyssinus  5,000 AU/ml Negative    53. Cat Hair 10,000 BAU/ml Negative    54.  Dog Epithelia Negative    55. Mixed Feathers Negative    56. Horse Epithelia Negative    57. Cockroach, German Negative    58. Mouse Negative    59. Tobacco Leaf Negative               Assessment:   1. Mild intermittent asthma without complication   2. Perennial allergic rhinitis   3. Allergic conjunctivitis of both eyes   4. Keratosis pilaris     Plan/Recommendations:   Mild Intermittent Asthma - MDI technique discussed.  Keep track of how often you require albuterol.   - Maintenance inhaler: none - Rescue inhaler: Albuterol 2 puffs every 4-6 hours as needed for respiratory symptoms of cough, shortness of breath, or wheezing Asthma control goals:  Full participation in all desired activities (may need albuterol before activity) Albuterol use two times or less a week on average (not counting use with activity) Cough interfering with sleep two times or less a month Oral steroids no more than once a year No hospitalizations  Allergic Rhinitis Allergic Conjunctivitis  - Positive skin test 10/2022: mold and dust mite  -  Avoidance measures discussed. - Use nasal saline rinses before nose sprays such as with Neilmed Sinus Rinse.  Use distilled water.   - Use Flonase 2 sprays each nostril daily. Aim upward and outward. - Use Azelastine 1-2 sprays each nostril twice daily as needed. Aim upward and outward. - Use Zyrtec 10 mg daily as needed for runny nose, sneezing, itchy watery eyes.   - Consider allergy shots as long term control of your symptoms by teaching your immune system to be more tolerant of your allergy triggers  Keratosis Pilaris  - Do a daily soaking tub bath in warm water for 10-15 minutes.  - Use a gentle, unscented cleanser at the end of the bath (such as Dove unscented bar or baby wash, or Aveeno sensitive body wash). Then rinse, pat half-way dry, and apply a gentle, unscented moisturizer cream or ointment (Cerave, Cetaphil, Eucerin, Aveeno) all over while still damp. The skin should be moisturized with a gentle, unscented moisturizer at least twice daily.  - Use only unscented liquid laundry detergent.  Return in about 2 months (around 12/17/2022).  Alesia Morin, MD [facility]

## 2022-10-16 NOTE — Patient Instructions (Addendum)
Asthma: - MDI technique discussed.  Keep track of how often you require albuterol.   - Maintenance inhaler: none - Rescue inhaler: Albuterol 2 puffs every 4-6 hours as needed for respiratory symptoms of cough, shortness of breath, or wheezing Asthma control goals:  Full participation in all desired activities (may need albuterol before activity) Albuterol use two times or less a week on average (not counting use with activity) Cough interfering with sleep two times or less a month Oral steroids no more than once a year No hospitalizations  Allergic Rhinitis Allergic Conjunctivitis  - Positive skin test 10/2022: mold and dust mite  - Avoidance measures discussed. - Use nasal saline rinses before nose sprays such as with Neilmed Sinus Rinse.  Use distilled water.   - Use Flonase 2 sprays each nostril daily. Aim upward and outward. - Use Azelastine 1-2 sprays each nostril twice daily as needed. Aim upward and outward. - Use Zyrtec 10 mg daily as needed for runny nose, sneezing, itchy watery eyes.   - Consider allergy shots as long term control of your symptoms by teaching your immune system to be more tolerant of your allergy triggers  Keratosis Pilaris  - Do a daily soaking tub bath in warm water for 10-15 minutes.  - Use a gentle, unscented cleanser at the end of the bath (such as Dove unscented bar or baby wash, or Aveeno sensitive body wash). Then rinse, pat half-way dry, and apply a gentle, unscented moisturizer cream or ointment (Cerave, Cetaphil, Eucerin, Aveeno) all over while still damp. The skin should be moisturized with a gentle, unscented moisturizer at least twice daily.  - Use only unscented liquid laundry detergent.   ALLERGEN AVOIDANCE MEASURES   Dust Mites Use central air conditioning and heat; and change the filter monthly.  Pleated filters work better than mesh filters.  Electrostatic filters may also be used; wash the filter monthly.  Window air conditioners may be  used, but do not clean the air as well as a central air conditioner.  Change or wash the filter monthly. Keep windows closed.  Do not use attic fans.   Encase the mattress, box springs and pillows with zippered, dust proof covers. Wash the bed linens in hot water weekly.   Remove carpet, especially from the bedroom. Remove stuffed animals, throw pillows, dust ruffles, heavy drapes and other items that collect dust from the bedroom. Do not use a humidifier.   Use wood, vinyl or leather furniture instead of cloth furniture in the bedroom. Keep the indoor humidity at 30 - 40%.  Monitor with a humidity gauge.  Molds - Indoor avoidance Use air conditioning to reduce indoor humidity.  Do not use a humidifier. Keep indoor humidity at 30 - 40%.  Use a dehumidifier if needed. In the bathroom use an exhaust fan or open a window after showering.  Wipe down damp surfaces after showering.  Clean bathrooms with a mold-killing solution (diluted bleach, or products like Tilex, etc) at least once a month. In the kitchen use an exhaust fan to remove steam from cooking.  Throw away spoiled foods immediately, and empty garbage daily.  Empty water pans below self-defrosting refrigerators frequently. Vent the clothes dryer to the outside. Limit indoor houseplants; mold grows in the dirt.  No houseplants in the bedroom. Remove carpet from the bedroom. Encase the mattress and box springs with a zippered encasing.  Molds - Outdoor avoidance Avoid being outside when the grass is being mowed, or the ground is tilled.  Avoid playing in leaves, pine straw, hay, etc.  Dead plant materials contain mold. Avoid going into barns or grain storage areas. Remove leaves, clippings and compost from around the home.

## 2022-12-18 ENCOUNTER — Ambulatory Visit: Payer: Medicaid Other | Admitting: Internal Medicine

## 2023-05-03 ENCOUNTER — Ambulatory Visit: Payer: Medicaid Other | Admitting: Cardiovascular Disease

## 2023-06-03 ENCOUNTER — Ambulatory Visit: Payer: Medicaid Other | Admitting: Internal Medicine

## 2023-06-11 ENCOUNTER — Ambulatory Visit: Payer: Medicaid Other | Attending: Cardiovascular Disease | Admitting: Internal Medicine

## 2023-06-17 ENCOUNTER — Encounter: Payer: Self-pay | Admitting: General Practice

## 2023-07-03 ENCOUNTER — Other Ambulatory Visit: Payer: Self-pay | Admitting: Internal Medicine

## 2024-02-23 ENCOUNTER — Emergency Department (HOSPITAL_COMMUNITY)
Admission: EM | Admit: 2024-02-23 | Discharge: 2024-02-23 | Disposition: A | Discharge status: Home / Self Care | Attending: Emergency Medicine | Admitting: Emergency Medicine

## 2024-02-23 ENCOUNTER — Emergency Department (HOSPITAL_COMMUNITY)

## 2024-02-23 ENCOUNTER — Encounter (HOSPITAL_COMMUNITY): Payer: Self-pay | Admitting: Emergency Medicine

## 2024-02-23 ENCOUNTER — Other Ambulatory Visit: Payer: Self-pay

## 2024-02-23 DIAGNOSIS — W19XXXA Unspecified fall, initial encounter: Secondary | ICD-10-CM

## 2024-02-23 DIAGNOSIS — R519 Headache, unspecified: Secondary | ICD-10-CM | POA: Insufficient documentation

## 2024-02-23 MED ORDER — ACETAMINOPHEN 325 MG PO TABS
650.0000 mg | ORAL_TABLET | Freq: Once | ORAL | Status: AC
  Administered 2024-02-23: 650 mg via ORAL
  Filled 2024-02-23: qty 2

## 2024-02-23 MED ORDER — METHOCARBAMOL 500 MG PO TABS: 500.0000 mg | ORAL_TABLET | Freq: Two times a day (BID) | ORAL | 0 refills | Status: AC

## 2024-02-23 NOTE — ED Provider Notes (Signed)
 [place] EMERGENCY DEPARTMENT AT [facility] Provider Note   CSN: [id number] Arrival date & time: 02/23/24  1453    History  Chief Complaint  Patient presents with   Jason Fuller is Fuller 23 y.o. male here for evaluation of fall off of Fuller horse.  He hit his head.  He has some facial bone pain.  He denies any headache, neck pain, back pain, chest pain, shortness of breath, abdominal pain, numbness or weakness to extremities.  No pain or swelling to extremities.  Ambulatory here.  No anticoagulation.  No syncope.  HPI     Home Medications Prior to Admission medications   Medication Sig Start Date End Date Taking? Authorizing Provider  methocarbamol  (ROBAXIN ) 500 MG tablet Take 1 tablet (500 mg total) by mouth 2 (two) times daily. 02/23/24  Yes Jason Bays A, PA-C  azelastine  (ASTELIN ) 0.1 % nasal spray Place 1 spray into both nostrils 2 (two) times daily as needed for rhinitis. Use in each nostril as directed 10/16/22   Jason Orleans, MD  cetirizine  (ZYRTEC ) 10 MG tablet Take 1 tablet (10 mg total) by mouth daily as needed for allergies. 10/16/22   Jason Orleans, MD  clobetasol (TEMOVATE) 0.05 % external solution Apply 1 Application topically 2 (two) times daily.    [provider]  DERMA-SMOOTHE/FS BODY 0.01 % OIL Apply 0.01 % topically as directed.    [provider]  desonide (DESOWEN) 0.05 % cream Apply 1 Application topically 2 (two) times daily as needed.    [provider]  fluticasone  (FLONASE ) 50 MCG/ACT nasal spray Place 2 sprays into both nostrils daily. 10/16/22   Jason Orleans, MD  ibuprofen  (ADVIL ) 800 MG tablet Take 1 tablet (800 mg total) by mouth 3 (three) times daily. 04/14/22   Jason Heckle, MD  ketoconazole (NIZORAL) 2 % shampoo Apply 1 Application topically 2 (two) times Fuller week. 08/28/22   [provider]  VENTOLIN  HFA 108 (90 Base) MCG/ACT inhaler Inhale 1-2 puffs into the lungs as needed. 10/16/22    Jason Orleans, MD      Allergies    Patient has no known allergies.    Review of Systems   Review of Systems  Constitutional: Negative.   HENT: Negative.         Diffuse forehead pain  Respiratory: Negative.    Cardiovascular: Negative.   Gastrointestinal: Negative.   Genitourinary: Negative.   Musculoskeletal: Negative.   Skin: Negative.   Neurological: Negative.   All other systems reviewed and are negative.   Physical Exam Updated Vital Signs BP (!) 140/71 (BP Location: Right Arm)   Pulse 60   Temp 98.8 F (37.1 C) (Oral)   Resp 18   SpO2 100%  Physical Exam Vitals and nursing note reviewed.  Constitutional:      General: He is not in acute distress.    Appearance: He is well-developed. He is not ill-appearing, toxic-appearing or diaphoretic.  HENT:     Head: Normocephalic.     Comments: Diffuse tenderness forehead, nasal bone,    Nose:     Comments: No septal hematoma    Mouth/Throat:     Comments: No loose dentition Eyes:     Pupils: Pupils are equal, round, and reactive to light.  Neck:     Comments: No midline cervical tenderness, full range of motion Cardiovascular:     Rate and Rhythm: Normal rate and regular rhythm.  Pulses: Normal pulses.          Radial pulses are 2+ on the right side and 2+ on the left side.     Heart sounds: Normal heart sounds.  Pulmonary:     Effort: Pulmonary effort is normal. No respiratory distress.     Breath sounds: Normal breath sounds.  Abdominal:     General: Bowel sounds are normal. There is no distension.     Palpations: Abdomen is soft.     Tenderness: There is no abdominal tenderness. There is no right CVA tenderness, left CVA tenderness or guarding.  Musculoskeletal:        General: Normal range of motion.     Cervical back: Normal range of motion and neck supple.     Comments: No bony tenderness bilateral upper and lower extremities.  Full range of motion.  Skin:    General: Skin is warm and dry.      Capillary Refill: Capillary refill takes less than 2 seconds.     Comments: No contusions, abrasions, ecchymosis  Neurological:     General: No focal deficit present.     Mental Status: He is alert and oriented to person, place, and time.     Cranial Nerves: No cranial nerve deficit.     Motor: No weakness.     Gait: Gait normal.     ED Results / Procedures / Treatments   Labs (all labs ordered are listed, but only abnormal results are displayed) Labs Reviewed - No data to display  EKG None  Radiology CT Cervical Spine Wo Contrast Result Date: 02/23/2024 CLINICAL DATA:  Jason Fuller off horse EXAM: CT CERVICAL SPINE WITHOUT CONTRAST TECHNIQUE: Multidetector CT imaging of the cervical spine was performed without intravenous contrast. Multiplanar CT image reconstructions were also generated. RADIATION DOSE REDUCTION: This exam was performed according to the departmental dose-optimization program which includes automated exposure control, adjustment of the mA and/or kV according to patient size and/or use of iterative reconstruction technique. COMPARISON:  CT cervical spine 04/14/2022 FINDINGS: Alignment: Mild straightening of the cervical spine. No subluxation. Facet alignment is normal Skull base and vertebrae: No acute fracture. No primary bone lesion or focal pathologic process. Soft tissues and spinal canal: No prevertebral fluid or swelling. No visible canal hematoma. Disc levels:  Within normal limits Upper chest: Negative. Other: None IMPRESSION: Mild straightening of the cervical spine. No acute osseous abnormality. Electronically Signed   By: Jason Fuller M.D.   On: 02/23/2024 16:31   DG Pelvis 1-2 Views Result Date: 02/23/2024 CLINICAL DATA:  Jason Fuller off horse EXAM: PELVIS - 1-2 VIEW COMPARISON:  None Available. FINDINGS: There is no evidence of pelvic fracture or diastasis. No pelvic bone lesions are seen. IMPRESSION: Negative. Electronically Signed   By: Jason Fuller M.D.   On: 02/23/2024  16:28   DG Chest 2 View Result Date: 02/23/2024 CLINICAL DATA:  Jason Fuller off horse EXAM: CHEST - 2 VIEW COMPARISON:  10/14/2019 FINDINGS: The heart size and mediastinal contours are within normal limits. Both lungs are clear. The visualized skeletal structures are unremarkable. IMPRESSION: No active cardiopulmonary disease. Electronically Signed   By: Jason Fuller M.D.   On: 02/23/2024 16:28   CT Maxillofacial Wo Contrast Result Date: 02/23/2024 CLINICAL DATA:  Jason Fuller off horse and hit face EXAM: CT MAXILLOFACIAL WITHOUT CONTRAST TECHNIQUE: Multidetector CT imaging of the maxillofacial structures was performed. Multiplanar CT image reconstructions were also generated. RADIATION DOSE REDUCTION: This exam was performed according to the departmental dose-optimization program  which includes automated exposure control, adjustment of the mA and/or kV according to patient size and/or use of iterative reconstruction technique. COMPARISON:  None Available. FINDINGS: Osseous: Mastoid air cells are clear. Mandibular heads are normally position. No mandibular fracture. Pterygoid plates and zygomatic arches appear intact. No acute nasal bone fracture Orbits: Negative. No traumatic or inflammatory finding. Sinuses: No acute sinus wall fracture. Moderate severe mucosal thickening in the left greater than right maxillary sinuses with patchy mucosal thickening in the ethmoid and frontal sinuses. Soft tissues: Swelling over the nasal area. Limited intracranial: See separately dictated head CT IMPRESSION: No acute facial bone fracture identified Electronically Signed   By: Jason Fuller M.D.   On: 02/23/2024 16:28   CT HEAD WO CONTRAST ( ) Result Date: 02/23/2024 CLINICAL DATA:  Facial trauma, blunt EXAM: CT HEAD WITHOUT CONTRAST TECHNIQUE: Contiguous axial images were obtained from the base of the skull through the vertex without intravenous contrast. RADIATION DOSE REDUCTION: This exam was performed according to the  departmental dose-optimization program which includes automated exposure control, adjustment of the mA and/or kV according to patient size and/or use of iterative reconstruction technique. COMPARISON:  04/14/2022. FINDINGS: Brain: No evidence of acute infarction, hemorrhage, hydrocephalus, extra-axial collection or mass lesion/mass effect. Vascular: No hyperdense vessel or unexpected calcification. Skull: Normal. Negative for fracture or focal lesion. Sinuses/Orbits: No acute finding. IMPRESSION: No acute intracranial process. Electronically Signed   By: Sydell Eva M.D.   On: 02/23/2024 16:24    Procedures Procedures    Medications Ordered in ED Medications  acetaminophen  (TYLENOL ) tablet 650 mg (650 mg Oral Given 02/23/24 1735)   ED Course/ Medical Decision Making/ Fuller&P   23 year old here for evaluation of fall of course earlier today.  He has diffuse pain to his facial bones.  Nonfocal neuroexam without deficits.  No midline spinal tenderness.  Nontender mental upper and lower extremities.  No obvious traumatic injuries to chest or abdomen.  No ecchymosis, tenderness.  Imaging personally viewed interpreted No significant abnormality  Discussed with patient and friend in room.  He does have nonfocal neuroexam without deficits.  Will follow-up outpatient, return for any worsening symptoms  The patient has been appropriately medically screened and/or stabilized in the ED. I have low suspicion for any other emergent medical condition which would require further screening, evaluation or treatment in the ED or require inpatient management.  Patient is hemodynamically stable and in no acute distress.  Patient able to ambulate in department prior to ED.  Evaluation does not show acute pathology that would require ongoing or additional emergent interventions while in the emergency department or further inpatient treatment.  I have discussed the diagnosis with the patient and answered all  questions.  Pain is been managed while in the emergency department and patient has no further complaints prior to discharge.  Patient is comfortable with plan discussed in room and is stable for discharge at this time.  I have discussed strict return precautions for returning to the emergency department.  Patient was encouraged to follow-up with PCP/specialist refer to at discharge.                                      Medical Decision Making Amount and/or Complexity of Data Reviewed Radiology: ordered.  Risk OTC drugs.           Final Clinical Impression(s) / ED Diagnoses Final diagnoses:  Fall, initial encounter  Facial pain    Rx / DC Orders ED Discharge Orders          Ordered    methocarbamol  (ROBAXIN ) 500 MG tablet  2 times daily        02/23/24 1804              Darryn Kydd A, PA-C 02/23/24 1804    Jerilynn Montenegro, MD 02/25/24 1607

## 2024-02-23 NOTE — ED Notes (Signed)
 Writer attempted to give pt his discharge paperwork, but he advised he didn't need it and I walked out.

## 2024-02-23 NOTE — ED Triage Notes (Signed)
 Pt reports he fell off a horse today. Pt reports hitting his face. Denies neck pain. Denis LOC.

## 2024-02-23 NOTE — Discharge Instructions (Signed)
 It was a pleasure taking care of you here today.  Your imaging did not show a significant abnormality  You will most likely feel very sore tomorrow.  You may take Tylenol  and ibuprofen .  I have written you for a short course of muscle relaxers to help as well.  Return for any worsening symptoms otherwise follow-up with primary care provider

## 2024-04-05 ENCOUNTER — Encounter (HOSPITAL_COMMUNITY): Payer: Self-pay | Admitting: Emergency Medicine

## 2024-04-05 ENCOUNTER — Emergency Department (HOSPITAL_COMMUNITY)

## 2024-04-05 ENCOUNTER — Emergency Department (HOSPITAL_COMMUNITY)
Admission: EM | Admit: 2024-04-05 | Discharge: 2024-04-05 | Disposition: A | Discharge status: Home / Self Care | Attending: Emergency Medicine | Admitting: Emergency Medicine

## 2024-04-05 DIAGNOSIS — M79651 Pain in right thigh: Secondary | ICD-10-CM | POA: Diagnosis present

## 2024-04-05 DIAGNOSIS — S8011XA Contusion of right lower leg, initial encounter: Secondary | ICD-10-CM

## 2024-04-05 DIAGNOSIS — S81811A Laceration without foreign body, right lower leg, initial encounter: Secondary | ICD-10-CM | POA: Diagnosis not present

## 2024-04-05 DIAGNOSIS — Y9352 Activity, horseback riding: Secondary | ICD-10-CM | POA: Diagnosis not present

## 2024-04-05 DIAGNOSIS — Z23 Encounter for immunization: Secondary | ICD-10-CM | POA: Insufficient documentation

## 2024-04-05 LAB — CBC WITH DIFFERENTIAL/PLATELET
Abs Immature Granulocytes: 0.03 10*3/uL (ref 0.00–0.07)
Basophils Absolute: 0.1 10*3/uL (ref 0.0–0.1)
Basophils Relative: 1 %
Eosinophils Absolute: 0.4 10*3/uL (ref 0.0–0.5)
Eosinophils Relative: 4 %
HCT: 42.7 % (ref 39.0–52.0)
Hemoglobin: 14 g/dL (ref 13.0–17.0)
Immature Granulocytes: 0 %
Lymphocytes Relative: 30 %
Lymphs Abs: 2.5 10*3/uL (ref 0.7–4.0)
MCH: 29.2 pg (ref 26.0–34.0)
MCHC: 32.8 g/dL (ref 30.0–36.0)
MCV: 89 fL (ref 80.0–100.0)
Monocytes Absolute: 0.9 10*3/uL (ref 0.1–1.0)
Monocytes Relative: 11 %
Neutro Abs: 4.7 10*3/uL (ref 1.7–7.7)
Neutrophils Relative %: 54 %
Platelets: 280 10*3/uL (ref 150–400)
RBC: 4.8 MIL/uL (ref 4.22–5.81)
RDW: 12.4 % (ref 11.5–15.5)
WBC: 8.5 10*3/uL (ref 4.0–10.5)
nRBC: 0 % (ref 0.0–0.2)

## 2024-04-05 LAB — BASIC METABOLIC PANEL WITH GFR
Anion gap: 8 (ref 5–15)
BUN: 15 mg/dL (ref 6–20)
CO2: 25 mmol/L (ref 22–32)
Calcium: 9.2 mg/dL (ref 8.9–10.3)
Chloride: 105 mmol/L (ref 98–111)
Creatinine, Ser: 1.07 mg/dL (ref 0.61–1.24)
GFR, Estimated: >60 mL/min (ref >60)
Glucose, Bld: 87 mg/dL (ref 70–99)
Potassium: 3.4 mmol/L — ABNORMAL LOW (ref 3.5–5.1)
Sodium: 138 mmol/L (ref 135–145)

## 2024-04-05 MED ORDER — TETANUS-DIPHTH-ACELL PERTUSSIS 5-2.5-18.5 LF-MCG/0.5 IM SUSY
0.5000 mL | PREFILLED_SYRINGE | Freq: Once | INTRAMUSCULAR | Status: AC
  Administered 2024-04-05: 0.5 mL via INTRAMUSCULAR
  Filled 2024-04-05: qty 0.5

## 2024-04-05 NOTE — ED Triage Notes (Addendum)
 PT was riding horse and horse reared up and fell backwards. He went back with the horse and western saddle horn landed on R thigh; horse rolled back onto him then off in one motion. Pt is unsure if hit his head. He is experienced rider- breaks in horses. Does not wear a helmet. PT admits to ETOH.  Pt reports he is having numbness in toes. Pedal pulse intact and marked. Swelling noted to thigh, very tender to touch.

## 2024-04-05 NOTE — ED Provider Notes (Signed)
 [place] EMERGENCY DEPARTMENT AT [facility] Provider Note   CSN: [id number] Arrival date & time: 04/05/24  0434     History  Chief Complaint  Patient presents with   Leg Injury    Ranen Doolin is a 23 y.o. male.  23 year old male presents with complaint of right thigh pain. Patient states he was riding a horse last night when the horse reared up and both went to the ground. The horse got up and off of the patient, patient got up and was then able to load the horse into a trailer and drive him to their destination. Denies hitting head, no LOC, has been ambulatory without difficulty since the incident. No other injuries or concerns.        Home Medications Prior to Admission medications   Medication Sig Start Date End Date Taking? Authorizing Provider  azelastine  (ASTELIN ) 0.1 % nasal spray Place 1 spray into both nostrils 2 (two) times daily as needed for rhinitis. Use in each nostril as directed 10/16/22   Kandice Orleans, MD  cetirizine  (ZYRTEC ) 10 MG tablet Take 1 tablet (10 mg total) by mouth daily as needed for allergies. 10/16/22   Kandice Orleans, MD  clobetasol (TEMOVATE) 0.05 % external solution Apply 1 Application topically 2 (two) times daily.    [provider]  DERMA-SMOOTHE/FS BODY 0.01 % OIL Apply 0.01 % topically as directed.    [provider]  desonide (DESOWEN) 0.05 % cream Apply 1 Application topically 2 (two) times daily as needed.    [provider]  fluticasone  (FLONASE ) 50 MCG/ACT nasal spray Place 2 sprays into both nostrils daily. 10/16/22   Kandice Orleans, MD  ibuprofen  (ADVIL ) 800 MG tablet Take 1 tablet (800 mg total) by mouth 3 (three) times daily. 04/14/22   Wynetta Heckle, MD  ketoconazole (NIZORAL) 2 % shampoo Apply 1 Application topically 2 (two) times a week. 08/28/22   [provider]  methocarbamol  (ROBAXIN ) 500 MG tablet Take 1 tablet (500 mg total) by mouth 2 (two) times daily. 02/23/24    Henderly, Britni A, PA-C  VENTOLIN  HFA 108 (90 Base) MCG/ACT inhaler Inhale 1-2 puffs into the lungs as needed. 10/16/22   Kandice Orleans, MD      Allergies    Patient has no known allergies.    Review of Systems   Review of Systems Negative except as per HPI Physical Exam Updated Vital Signs BP 133/60   Pulse 62   Temp 99.4 F (37.4 C) (Oral)   Resp 12   SpO2 97%  Physical Exam Vitals and nursing note reviewed.  Constitutional:      General: He is not in acute distress.    Appearance: He is well-developed. He is not diaphoretic.  HENT:     Head: Normocephalic and atraumatic.  Cardiovascular:     Pulses: Normal pulses.  Pulmonary:     Effort: Pulmonary effort is normal.  Musculoskeletal:        General: Swelling and tenderness present. No deformity.     Right lower leg: No edema.     Left lower leg: No edema.       Legs:     Comments: Compartments soft, ttp medial quad. Superficial laceration to lateral lower leg, no active bleeding  Skin:    General: Skin is warm and dry.     Findings: No bruising.  Neurological:     Mental Status: He is alert and oriented to person, place,  and time.     Sensory: No sensory deficit.     Motor: No weakness.  Psychiatric:        Behavior: Behavior normal.     ED Results / Procedures / Treatments   Labs (all labs ordered are listed, but only abnormal results are displayed) Labs Reviewed  BASIC METABOLIC PANEL WITH GFR - Abnormal; Notable for the following components:      Result Value   Potassium 3.4 (*)    All other components within normal limits  CBC WITH DIFFERENTIAL/PLATELET    EKG None  Radiology CT Cervical Spine Wo Contrast Result Date: 04/05/2024 CLINICAL DATA:  23 year old male status post fall while horseback riding, breaking horses. Fall backwards. EXAM: CT CERVICAL SPINE WITHOUT CONTRAST TECHNIQUE: Multidetector CT imaging of the cervical spine was performed without intravenous contrast. Multiplanar CT image  reconstructions were also generated. RADIATION DOSE REDUCTION: This exam was performed according to the departmental dose-optimization program which includes automated exposure control, adjustment of the mA and/or kV according to patient size and/or use of iterative reconstruction technique. COMPARISON:  Head CT today.  Cervical spine CT 02/23/2024. FINDINGS: Alignment: Stable straightening of cervical lordosis. Cervicothoracic junction alignment is within normal limits. Bilateral posterior element alignment is within normal limits. Skull base and vertebrae: Bone mineralization is within normal limits. Visualized skull base is intact. No atlanto-occipital dissociation. C1 and C2 appear intact and aligned. No osseous abnormality identified. Soft tissues and spinal canal: No prevertebral fluid or swelling. No visible canal hematoma. Negative visible noncontrast neck soft tissues. Disc levels:  Negative. Upper chest: Visible upper thoracic levels appear intact. Clear lung apices. IMPRESSION: No acute traumatic injury identified in the cervical spine. Electronically Signed   By: Marlise Simpers M.D.   On: 04/05/2024 06:26   CT Head Wo Contrast Result Date: 04/05/2024 CLINICAL DATA:  23 year old male status post fall while horseback riding, breaking horses. Fall backwards. EXAM: CT HEAD WITHOUT CONTRAST TECHNIQUE: Contiguous axial images were obtained from the base of the skull through the vertex without intravenous contrast. RADIATION DOSE REDUCTION: This exam was performed according to the departmental dose-optimization program which includes automated exposure control, adjustment of the mA and/or kV according to patient size and/or use of iterative reconstruction technique. COMPARISON:  Head CT 02/23/2024. FINDINGS: Brain: Normal cerebral volume. No midline shift, ventriculomegaly, mass effect, evidence of mass lesion, intracranial hemorrhage or evidence of cortically based acute infarction. Gray-white matter  differentiation is within normal limits throughout the brain. Vascular: No suspicious intracranial vascular hyperdensity. Skull: Intact.  No acute fracture identified. Sinuses/Orbits: Layering fluid but low-density in the right maxillary sinus, appears to be inflammatory. Left maxillary sinus mucosal thickening and retention cysts. Other Visualized paranasal sinuses and mastoids are stable and well aerated. Tympanic cavities appear clear. Other: No discrete orbit or scalp soft tissue injury identified. IMPRESSION: 1. No acute traumatic injury identified. Normal noncontrast CT appearance of the brain. 2. Mild maxillary sinus inflammation. Electronically Signed   By: Marlise Simpers M.D.   On: 04/05/2024 06:24   DG Femur Min 2 Views Right Result Date: 04/05/2024 CLINICAL DATA:  23 year old male status post fall while horseback riding. Right thigh blunt trauma and pain. EXAM: RIGHT FEMUR 2 VIEWS COMPARISON:  Right femur series 04/14/2022. FINDINGS: Bone mineralization is within normal limits. Visible right hemipelvis appears intact. Right femoral head normally located. Proximal right femur stable and intact. Right femoral shaft, distal femur intact. Alignment appears maintained at the right knee, along with joint spaces there. No discrete  soft tissue injury, no soft tissue gas. IMPRESSION: Negative. Electronically Signed   By: Marlise Simpers M.D.   On: 04/05/2024 05:32    Procedures Procedures    Medications Ordered in ED Medications  Tdap (BOOSTRIX) injection 0.5 mL (has no administration in time range)    ED Course/ Medical Decision Making/ A&P                                 Medical Decision Making  This patient presents to the ED for concern of right leg pain, this involves an extensive number of treatment options, and is a complaint that carries with it a high risk of complications and morbidity.  The differential diagnosis includes fracture, contusion, compartment syndrome, intracranial injury, c-spine  injury    Co morbidities / Chronic conditions that complicate the patient evaluation  Otherwise healthy    Additional history obtained:  Additional history obtained from EMR External records from outside source obtained and reviewed including vaccine history not on file   Lab Tests:  I Ordered, and personally interpreted labs.  The pertinent results include: CBC within normal is.  BMP with mild hypokalemia at 3.4.   Imaging Studies ordered:  I ordered imaging studies including CT head, CT c-spine, right femur  I independently visualized and interpreted imaging which showed no acute injury I agree with the radiologist interpretation   Problem List / ED Course / Critical interventions / Medication management  23 year old otherwise healthy male presents with right leg pain after fall from horse as above.  Patient was able to then load the horse into the trailer and drive to their destination.  Patient has been ambulatory on this leg without significant difficulty.  On exam, he has tenderness and swelling to the medial quad area.  No sensory deficits, gait is intact.  He has a small superficial laceration to the lateral right lower leg which does not require closure.  Tetanus will be updated.  Triage obtained CT head, C-spine, x-ray of the femur.  These images do not reveal any acute traumatic injuries.  Discussed results with patient.  CBC with normal H&H.  Offered crutches however patient states he has a pair at home if needed.  He plans to take Aleve  for his pain.  Recommend alternate ice and heat and follow-up with orthopedics if not improving. I have reviewed the patients home medicines and have made adjustments as needed   Consultations Obtained:  I requested consultation with the ER attending, Dr. Maralee Senate,  and discussed lab and imaging findings as well as pertinent plan - they recommend: agrees with plan of care   Social Determinants of Health:  Has PCP   Test /  Admission - Considered:  Stable for dc         Final Clinical Impression(s) / ED Diagnoses Final diagnoses:  Contusion of right lower extremity, initial encounter    Rx / DC Orders ED Discharge Orders     None         Darlis Eisenmenger, PA-C 04/05/24 0750    Palumbo, April, MD 04/05/24 2344

## 2024-04-05 NOTE — Discharge Instructions (Addendum)
 Take Aleve  as directed as needed for pain. Apply ice for 20 minutes at a time over a thin cloth to help with pain and swelling. After the first 48 hours, can switch to moist heat. Follow up with orthopedics if not improving after 1 week.  Return to the ER at any time for worsening or concerning symptoms, especially worsening pain, leg weakness or numbness, worsening swelling, other concerning symptoms.

## 2024-04-05 NOTE — ED Provider Triage Note (Signed)
 Emergency Medicine Provider Triage Evaluation Note  [facility] , a 23 y.o. male  was evaluated in triage.  Pt complains of being thrown off horse trail riding  Review of Systems  Positive: Leg swelling  Negative: Vomiting   Physical Exam  BP 124/69   Pulse 61   Temp 99.4 F (37.4 C) (Oral)   Resp 14   SpO2 97%  Gen:   Awake, no distress   Resp:  Normal effort  MSK:   Moves extremities without difficulty, compartments of RLE soft Other: NCAT   Medical Decision Making  Medically screening exam initiated at 6:06 AM.  Appropriate orders placed.  Yahya Rossy was informed that the remainder of the evaluation will be completed by another provider, this initial triage assessment does not replace that evaluation, and the importance of remaining in the ED until their evaluation is complete.     Kinnley Paulson, MD 04/05/24 [phone]

## 2024-07-15 ENCOUNTER — Ambulatory Visit: Attending: Sports Medicine | Admitting: Physical Therapy

## 2024-07-15 DIAGNOSIS — M25552 Pain in left hip: Secondary | ICD-10-CM | POA: Insufficient documentation

## 2024-07-15 DIAGNOSIS — M5459 Other low back pain: Secondary | ICD-10-CM | POA: Insufficient documentation

## 2024-07-15 DIAGNOSIS — M25551 Pain in right hip: Secondary | ICD-10-CM | POA: Insufficient documentation

## 2024-07-15 NOTE — Therapy (Deleted)
 OUTPATIENT PHYSICAL THERAPY LOWER EXTREMITY EVALUATION  Patient Name: Jason Fuller MRN: [id number] DOB:03-03-2001, 23 y.o., male Today's Date: 07/15/2024    Past Medical History:  Diagnosis Date   Asthma    Attention deficit disorder    Mood disorder [facility])    Past Surgical History:  Procedure Laterality Date   ADENOIDECTOMY     TONSILLECTOMY     Patient Active Problem List   Diagnosis Date Noted   Attention deficit disorder     PCP: Patient, No Pcp Per  REFERRING PROVIDER: Orpha Asberry RAMAN, MD  THERAPY DIAG:  No diagnosis found.  REFERRING DIAG: Hip tightness [R29.898] L achilles pain   Rationale for Evaluation and Treatment:  Rehabilitation  SUBJECTIVE:  PERTINENT PAST HISTORY:  ADHD        PRECAUTIONS: {Therapy precautions:24002}  WEIGHT BEARING RESTRICTIONS {Yes ***/No:24003}  FALLS:  Has patient fallen in last 6 months? {yes/no:20286}, Number of falls: ***  MOI/History of condition:  Onset date: ***  SUBJECTIVE STATEMENT  Pt is a 23 y.o. male who presents to clinic with chief complaint of ***.  ***   Red flags:  {has/denies:26543} {kerredflag:26542}  Pain:  Are you having pain? {yes/no:20286} Pain location: *** NPRS scale:  Best: {NUMBERS; 0-10:5044}/10, Worst: {NUMBERS; 0-10:5044}/10 Aggravating factors: *** Relieving factors: *** Pain description: {PAIN DESCRIPTION:21022940}  Occupation: ***  Assistive Device: ***  Hand Dominance: ***  Patient Goals/Specific Activities: ***   OBJECTIVE:   DIAGNOSTIC FINDINGS:  ***  GENERAL OBSERVATION/GAIT: ***  SENSATION: Light touch: {intact/deficits:24005}  PALPATION: ***  MUSCLE LENGTH: Hamstrings: Right {kerminsig:27227} restriction; Left {kerminsig:27227} restriction Hip flexors: Right {kerminsig:27227} restriction; Left {kerminsig:27227} restriction Rec Fem: Right {kerminsig:27227} restriction; Left {kerminsig:27227} restriction  LE MMT:  MMT Right (Eval) Left (Eval)   Hip flexion (L2, L3) *** ***  Knee extension (L3) *** ***  Knee flexion *** ***  Hip abduction *** ***  Hip extension *** ***  Hip external rotation    Hip internal rotation    Hip adduction    Ankle dorsiflexion (L4)    Ankle plantarflexion (S1)    Ankle inversion    Ankle eversion    Great Toe ext (L5)    Grossly     (Blank rows = not tested, score listed is out of 5 possible points.  N = WNL, D = diminished, C = clear for gross weakness with myotome testing, * = concordant pain with testing)  LE ROM:  ROM Right (Eval) Left (Eval)  Hip flexion    Hip extension    Hip abduction    Hip adduction    Hip internal rotation    Hip external rotation    Knee extension    Knee flexion    Ankle dorsiflexion    Ankle plantarflexion    Ankle inversion    Ankle eversion     (Blank rows = not tested, N = WNL, * = concordant pain with testing)  Functional Tests  Eval                                                              SPECIAL TESTS:  ***   PATIENT SURVEYS:  ***   TODAY'S TREATMENT:  Therapeutic Exercise: Creating, reviewing, and completing below HEP   PATIENT EDUCATION (/HM):  POC, diagnosis, prognosis, HEP,  and outcome measures.  Pt educated via explanation, demonstration, and handout (HEP).  Pt confirms understanding verbally.   HOME EXERCISE PROGRAM: ***  Treatment priorities   Eval                                                  ASSESSMENT:  CLINICAL IMPRESSION: Jason Fuller is a 23 y.o. male who presents to clinic with signs and sxs consistent with ***.   ***.   Jason Fuller will benefit from skilled PT to address relevant deficits and improve ***.   OBJECTIVE IMPAIRMENTS: Pain, ***  ACTIVITY LIMITATIONS: ***  PERSONAL FACTORS: See medical history and pertinent history   REHAB POTENTIAL: Good  CLINICAL DECISION MAKING: Evolving/moderate complexity  EVALUATION COMPLEXITY: Moderate   GOALS:   SHORT TERM GOALS:  Target date: ***  [name] will be >75% HEP compliant to improve carryover between sessions and facilitate independent management of condition  Evaluation: ongoing Goal status: INITIAL   LONG TERM GOALS: Target date: ***  Jason Fuller will self report >/= 50% decrease in pain from evaluation to improve function in daily tasks  Evaluation/Baseline: ***/10 max pain Goal status: INITIAL   2.  Jason Fuller will show a >/= *** pt improvement in LEFS score (MCID is ~11% or 9 pts) as a proxy for functional improvement   Evaluation/Baseline: *** pts Goal status: INITIAL   3.  Jason Fuller will be able to ***, not limited by pain  Evaluation/Baseline: limited Goal status: INITIAL   4.  ***   5.  ***   6.  ***   PLAN: PT FREQUENCY: 1-2x/week  PT DURATION: 8 weeks  PLANNED INTERVENTIONS:  97164- PT Re-evaluation, 97110-Therapeutic exercises, 97530- Therapeutic activity, [id number]- Neuromuscular re-education, 97535- Self Care, 02859- Manual therapy, [id number]- Gait training, [id number]- Aquatic Therapy, [phone]- Electrical stimulation (manual), [id number]- Vasopneumatic device, [id number]- Traction (mechanical), [id number]- Ionotophoresis 4mg /ml Dexamethasone, Taping, Dry Needling, Joint manipulation, and Spinal manipulation.   Jason Fuller PT, DPT 07/15/2024, 7:50 AM

## 2024-07-20 ENCOUNTER — Ambulatory Visit: Admitting: Physical Therapy

## 2024-07-20 ENCOUNTER — Encounter: Payer: Self-pay | Admitting: Physical Therapy

## 2024-07-20 ENCOUNTER — Other Ambulatory Visit: Payer: Self-pay

## 2024-07-20 DIAGNOSIS — M25552 Pain in left hip: Secondary | ICD-10-CM | POA: Diagnosis present

## 2024-07-20 DIAGNOSIS — M5459 Other low back pain: Secondary | ICD-10-CM | POA: Diagnosis present

## 2024-07-20 DIAGNOSIS — M25551 Pain in right hip: Secondary | ICD-10-CM

## 2024-07-20 NOTE — Therapy (Unsigned)
 OUTPATIENT PHYSICAL THERAPY LOWER EXTREMITY EVALUATION  Patient Name: Jason Fuller MRN: [id number] DOB:[birth year], 23 y.o., male Today's Date: 07/21/2024   PT End of Session - 07/21/24 0956     Visit Number 1    Date for PT Re-Evaluation 09/15/24    Authorization Type [employer] MCD    PT Start Time 1555   pt arrived late   PT Stop Time 1630    PT Time Calculation (min) 35 min          Past Medical History:  Diagnosis Date   Asthma    Attention deficit disorder    Mood disorder (HCC)    Past Surgical History:  Procedure Laterality Date   ADENOIDECTOMY     TONSILLECTOMY     Patient Active Problem List   Diagnosis Date Noted   Attention deficit disorder     PCP: Jason Asberry RAMAN, MD  REFERRING PROVIDER: Orpha Asberry RAMAN, MD  THERAPY DIAG:  Other low back pain - Plan: PT plan of care cert/re-cert  Pain of both hip joints - Plan: PT plan of care cert/re-cert  REFERRING DIAG: Hip tightness [R29.898]   Rationale for Evaluation and Treatment:  Rehabilitation  SUBJECTIVE:  PERTINENT PAST HISTORY:  ADHD        PRECAUTIONS: None  WEIGHT BEARING RESTRICTIONS No  FALLS:  Has patient fallen in last 6 months? Yes, Number of falls: 2 falls from horses  MOI/History of condition:  Onset date: my whole life  SUBJECTIVE STATEMENT  Pt is a 23 y.o. male who presents to clinic with chief complaint of hip tightness which he feels has been going on his entire life.  He feels that this leads to back pian and other issues.  He has not tried many stretches.  Lifts weights regularly.  Pain:  Are you having pain? Yes Pain location: low back NPRS scale:  Best: 0/10, Worst: 7/10 Aggravating factors: running Relieving factors: rest Pain description: sharp and aching  Occupation: DJ  Education administrator: NA  Hand Dominance: NA  Patient Goals/Specific Activities: reduce low back pain and get better hip flexibility    OBJECTIVE:   LUMBAR AROM  AROM AROM   (Eval)  Flexion Fingertips to mid shin  Extension limited by 75%  Right lateral flexion WNL  Left lateral flexion WNL  Right rotation limited by 25%  Left rotation limited by 25%    (Blank rows = not tested)    SENSATION: Light touch: Appears intact  PALPATION: No TTP  MUSCLE LENGTH: Hamstrings: Right no restriction; Left no restriction Hip flexors: Right significant restriction; Left significant restriction Rec Fem: Right significant restriction; Left significant restriction  LE ROM:  ROM Right (Eval) Left (Eval)  Hip flexion    Hip extension    Hip abduction    Hip adduction    Hip internal rotation 20 degrees 20 degrees  Hip external rotation 50 degrees 40 degrees  Knee extension    Knee flexion    Ankle dorsiflexion    Ankle plantarflexion    Ankle inversion    Ankle eversion     (Blank rows = not tested, N = WNL, * = concordant pain with testing)  Functional Tests  Eval  PATIENT SURVEYS:  LEFS: 73/80   TODAY'S TREATMENT:  Therapeutic Exercise: Creating, reviewing, and completing below HEP   PATIENT EDUCATION ([place]/HM):  POC, diagnosis, prognosis, HEP, and outcome measures.  Pt educated via explanation, demonstration, and handout (HEP).  Pt confirms understanding verbally.   HOME EXERCISE PROGRAM: Access Code: [email or web address] URL: [email or web address] Date: 07/20/2024 Prepared by: Jason Fuller  Exercises - Hip Flexor Stretch at [facility] of Bed  - 1 x daily - 7 x weekly - 1 sets - 3 reps - 45 second hold - Seated Piriformis Stretch with Trunk Bend  - 1 x daily - 7 x weekly - 1 sets - 3 reps - 45 sec hold - Hip Flexor Stretch on Step  - 1 x daily - 7 x weekly - 1 sets - 3 reps - 45 sec hold  Treatment priorities   Eval        Hip flexor and RF stretching        Review gym programming        Hip hinge/DL progression        L hip ER                   ASSESSMENT:  CLINICAL IMPRESSION: Jason Fuller is a 23 y.o. male who presents to clinic with signs and sxs consistent with bil hip mobility and intermittent low back pain.   Contributing factors to LBP may include hyperlordotic lumbar spine and significantly limited hip ext d/t tight hip flexors. Jason Fuller will benefit from skilled PT to address relevant deficits and improve comfort with running and more vigorous tasks.   OBJECTIVE IMPAIRMENTS: Pain, hip ROM, RF tightness  ACTIVITY LIMITATIONS: running, riding horses, weight lifting  PERSONAL FACTORS: See medical history and pertinent history   REHAB POTENTIAL: Good  CLINICAL DECISION MAKING: Evolving/moderate complexity  EVALUATION COMPLEXITY: Moderate   GOALS:   SHORT TERM GOALS: Target date: 08/17/2024   Jason Fuller will be >75% HEP compliant to improve carryover between sessions and facilitate independent management of condition  Evaluation: ongoing Goal status: INITIAL   LONG TERM GOALS: Target date: 09/14/2024   Jason Fuller will self report >/= 50% decrease in pain from evaluation to improve function in daily tasks  Evaluation/Baseline: 7/10 max pain Goal status: INITIAL   2.  Jason Fuller will show a >/= 7 pt improvement in LEFS score (MCID is ~11% or 9 pts) as a proxy for functional improvement   Evaluation/Baseline: 73 pts Goal status: INITIAL   3.  Jason Fuller will be able to run, not limited by pain  Evaluation/Baseline: limited Goal status: INITIAL   4.  Jason Fuller will show hip ext to neutral with thomas test  Evaluation/Baseline: limited Goal status: INITIAL    PLAN: PT FREQUENCY: 1x/week  PT DURATION: 8 weeks  PLANNED INTERVENTIONS:  97164- PT Re-evaluation, 97110-Therapeutic exercises, 97530- Therapeutic activity, [id number]- Neuromuscular re-education, 97535- Self Care, 02859- Manual therapy, [id number]- Gait training, [id number]- Aquatic Therapy, [phone]- Electrical stimulation (manual), [id number]- Vasopneumatic  device, [id number]- Traction (mechanical), [id number]- Ionotophoresis 4mg /ml Dexamethasone, Taping, Dry Needling, Joint manipulation, and Spinal manipulation.   Jason Fuller PT, DPT 07/21/2024, 11:59 AM  I just finished a MCD eval/recert.  Name: Jason Fuller  MRN: [id number] Please request 1x/week for 8 weeks.  Check all conditions that are expected to impact treatment: Musculoskeletal disorders   I DID put a charge in.  Check all possible CPT codes: 02889- Therapeutic Exercise, [phone]- Neuro Re-education, [phone] - Gait Training, [phone] - Manual Therapy, 97530 - Therapeutic Activities, [phone] -  Self Care, 02835 - Re-evaluation, [id number] - Mechanical traction, and [id number] - Aquatic therapy   Thank you!  MCD - Secure

## 2024-08-05 ENCOUNTER — Encounter: Payer: Self-pay | Admitting: Physical Therapy

## 2024-08-05 ENCOUNTER — Ambulatory Visit: Attending: Sports Medicine | Admitting: Physical Therapy

## 2024-08-05 DIAGNOSIS — M25551 Pain in right hip: Secondary | ICD-10-CM | POA: Insufficient documentation

## 2024-08-05 DIAGNOSIS — M25552 Pain in left hip: Secondary | ICD-10-CM | POA: Insufficient documentation

## 2024-08-05 DIAGNOSIS — M5459 Other low back pain: Secondary | ICD-10-CM | POA: Insufficient documentation

## 2024-08-05 NOTE — Therapy (Signed)
 OUTPATIENT PHYSICAL THERAPY DAILY NOTE  Patient Name: Jason Fuller MRN: [id number] DOB:Oct 29, 2001, 23 y.o., male Today's Date: 08/05/2024   PT End of Session - 08/05/24 0901     Visit Number 2    Date for Recertification  09/15/24    Authorization Type [employer] MCD    PT Start Time 0900   pt arrived late   PT Stop Time 0928    PT Time Calculation (min) 28 min          Past Medical History:  Diagnosis Date   Asthma    Attention deficit disorder    Mood disorder    Past Surgical History:  Procedure Laterality Date   ADENOIDECTOMY     TONSILLECTOMY     Patient Active Problem List   Diagnosis Date Noted   Attention deficit disorder     PCP: Orpha Asberry RAMAN, MD  REFERRING PROVIDER: Orpha Asberry RAMAN, MD  THERAPY DIAG:  Other low back pain  Pain of both hip joints  REFERRING DIAG: Hip tightness [R29.898]   Rationale for Evaluation and Treatment:  Rehabilitation  SUBJECTIVE:  PERTINENT PAST HISTORY:  ADHD        PRECAUTIONS: None  WEIGHT BEARING RESTRICTIONS No  FALLS:  Has patient fallen in last 6 months? Yes, Number of falls: 2 falls from horses  MOI/History of condition:  Onset date: my whole life  SUBJECTIVE STATEMENT  08/05/2024:  Pt reports that he has not been HEP compliant d/t traveling in [place].  EVAL:  Pt is a 23 y.o. male who presents to clinic with chief complaint of hip tightness which he feels has been going on his entire life.  He feels that this leads to back pian and other issues.  He has not tried many stretches.  Lifts weights regularly.  Pain:  Are you having pain? Yes Pain location: low back NPRS scale:  Best: 0/10, Worst: 7/10 Aggravating factors: running Relieving factors: rest Pain description: sharp and aching  Occupation: DJ  Education administrator: NA  Hand Dominance: NA  Patient Goals/Specific Activities: reduce low back pain and get better hip flexibility    OBJECTIVE:   LUMBAR AROM  AROM AROM   (Eval)  Flexion Fingertips to mid shin  Extension limited by 75%  Right lateral flexion WNL  Left lateral flexion WNL  Right rotation limited by 25%  Left rotation limited by 25%    (Blank rows = not tested)    SENSATION: Light touch: Appears intact  PALPATION: No TTP  MUSCLE LENGTH: Hamstrings: Right no restriction; Left no restriction Hip flexors: Right significant restriction; Left significant restriction Rec Fem: Right significant restriction; Left significant restriction  LE ROM:  ROM Right (Eval) Left (Eval)  Hip flexion    Hip extension    Hip abduction    Hip adduction    Hip internal rotation 20 degrees 20 degrees  Hip external rotation 50 degrees 40 degrees  Knee extension    Knee flexion    Ankle dorsiflexion    Ankle plantarflexion    Ankle inversion    Ankle eversion     (Blank rows = not tested, N = WNL, * = concordant pain with testing)  Functional Tests  Eval  PATIENT SURVEYS:  LEFS: 73/80   TODAY'S TREATMENT:  [facility] Adult PT Treatment  08/05/2024:  Therapeutic Exercise:  Modified thomas - edge of table Hip flexor stretch in chair Prone hip ext with pillow under hips - 2x10 ea Lunge sliders- 3 way    HOME EXERCISE PROGRAM: Access Code: [email or web address] URL: [email or web address] Date: 07/20/2024 Prepared by: Helene Gasmen  Exercises - Hip Flexor Stretch at [facility] of Bed  - 1 x daily - 7 x weekly - 1 sets - 3 reps - 45 second hold - Seated Piriformis Stretch with Trunk Bend  - 1 x daily - 7 x weekly - 1 sets - 3 reps - 45 sec hold - Hip Flexor Stretch on Step  - 1 x daily - 7 x weekly - 1 sets - 3 reps - 45 sec hold  Treatment priorities   Eval        Hip flexor and RF stretching        Review gym programming        Hip hinge/DL progression        L hip ER                  ASSESSMENT:  CLINICAL IMPRESSION: Nehemyah tolerated session  well with no adverse reaction.  Talked about importance of HEP; pt states he will be more compliant concentrated on combination of hip mobility and strengthening.  OBJECTIVE IMPAIRMENTS: Pain, hip ROM, RF tightness  ACTIVITY LIMITATIONS: running, riding horses, weight lifting  PERSONAL FACTORS: See medical history and pertinent history   REHAB POTENTIAL: Good  CLINICAL DECISION MAKING: Evolving/moderate complexity  EVALUATION COMPLEXITY: Moderate   GOALS:   SHORT TERM GOALS: Target date: 08/17/2024   Vashon will be >75% HEP compliant to improve carryover between sessions and facilitate independent management of condition  Evaluation: ongoing Goal status: INITIAL   LONG TERM GOALS: Target date: 09/14/2024   Lemoine will self report >/= 50% decrease in pain from evaluation to improve function in daily tasks  Evaluation/Baseline: 7/10 max pain Goal status: INITIAL   2.  Charan will show a >/= 7 pt improvement in LEFS score (MCID is ~11% or 9 pts) as a proxy for functional improvement   Evaluation/Baseline: 73 pts Goal status: INITIAL   3.  Elwin will be able to run, not limited by pain  Evaluation/Baseline: limited Goal status: INITIAL   4.  Damyon will show hip ext to neutral with thomas test  Evaluation/Baseline: limited Goal status: INITIAL    PLAN: PT FREQUENCY: 1x/week  PT DURATION: 8 weeks  PLANNED INTERVENTIONS:  97164- PT Re-evaluation, 97110-Therapeutic exercises, 97530- Therapeutic activity, [id number]- Neuromuscular re-education, 97535- Self Care, 02859- Manual therapy, [id number]- Gait training, [id number]- Aquatic Therapy, [id number]- Electrical stimulation (manual), [id number]- Vasopneumatic device, [id number]- Traction (mechanical), [id number]- Ionotophoresis 4mg /ml Dexamethasone, Taping, Dry Needling, Joint manipulation, and Spinal manipulation.   Helene Gasmen PT, DPT 08/05/2024, 9:29 AM  I just finished a MCD eval/recert.  Name: Jason Fuller  MRN:  [id number] Please request 1x/week for 8 weeks.  Check all conditions that are expected to impact treatment: Musculoskeletal disorders   I DID put a charge in.  Check all possible CPT codes: 02889- Therapeutic Exercise, [id number]- Neuro Re-education, [phone] - Gait Training, [phone] - Manual Therapy, 97530 - Therapeutic Activities, 97535 - Self Care, [phone] - Re-evaluation, [id number] - Mechanical traction, and [id number] - Aquatic therapy   Thank you!  MCD - Secure

## 2024-08-07 ENCOUNTER — Encounter: Payer: Self-pay | Admitting: Physical Therapy

## 2024-08-07 ENCOUNTER — Ambulatory Visit: Admitting: Physical Therapy

## 2024-08-07 DIAGNOSIS — M25551 Pain in right hip: Secondary | ICD-10-CM

## 2024-08-07 DIAGNOSIS — M5459 Other low back pain: Secondary | ICD-10-CM

## 2024-08-07 NOTE — Therapy (Signed)
 OUTPATIENT PHYSICAL THERAPY DAILY NOTE  Patient Name: Jason Fuller MRN: [id number] DOB:2000-12-16, 23 y.o., male Today's Date: 08/07/2024   PT End of Session - 08/07/24 0845     Visit Number 3    Date for Recertification  09/15/24    Authorization Type [employer] M[employer]    Authorization Time Period approved 10 PT visits from 07/20/24-09/18/24    Authorization - Visit Number 3    Authorization - Number of Visits 10    PT Start Time 0845    PT Stop Time 0926    PT Time Calculation (min) 41 min          Past Medical History:  Diagnosis Date   Asthma    Attention deficit disorder    Mood disorder    Past Surgical History:  Procedure Laterality Date   ADENOIDECTOMY     TONSILLECTOMY     Patient Active Problem List   Diagnosis Date Noted   Attention deficit disorder     PCP: Orpha Asberry RAMAN, MD  REFERRING PROVIDER: Orpha Asberry RAMAN, MD  THERAPY DIAG:  Other low back pain  Pain of both hip joints  REFERRING DIAG: Hip tightness [R29.898]   Rationale for Evaluation and Treatment:  Rehabilitation  SUBJECTIVE:  PERTINENT PAST HISTORY:  ADHD        PRECAUTIONS: None  WEIGHT BEARING RESTRICTIONS No  FALLS:  Has patient fallen in last 6 months? Yes, Number of falls: 2 falls from horses  MOI/History of condition:  Onset date: my whole life  SUBJECTIVE STATEMENT  08/07/2024:  Pt reports he is doing fairly well this morning.  He has been HEP.   EVAL:  Pt is a 23 y.o. male who presents to clinic with chief complaint of hip tightness which he feels has been going on his entire life.  He feels that this leads to back pian and other issues.  He has not tried many stretches.  Lifts weights regularly.  Pain:  Are you having pain? Yes Pain location: low back NPRS scale:  Best: 0/10, Worst: 7/10 Aggravating factors: running Relieving factors: rest Pain description: sharp and aching  Occupation: DJ  Education administrator: NA  Hand Dominance: NA  Patient  Goals/Specific Activities: reduce low back pain and get better hip flexibility    OBJECTIVE:   LUMBAR AROM  AROM AROM  (Eval)  Flexion Fingertips to mid shin  Extension limited by 75%  Right lateral flexion WNL  Left lateral flexion WNL  Right rotation limited by 25%  Left rotation limited by 25%    (Blank rows = not tested)    SENSATION: Light touch: Appears intact  PALPATION: No TTP  MUSCLE LENGTH: Hamstrings: Right no restriction; Left no restriction Hip flexors: Right significant restriction; Left significant restriction Rec Fem: Right significant restriction; Left significant restriction  LE ROM:  ROM Right (Eval) Left (Eval)  Hip flexion    Hip extension    Hip abduction    Hip adduction    Hip internal rotation 20 degrees 20 degrees  Hip external rotation 50 degrees 40 degrees  Knee extension    Knee flexion    Ankle dorsiflexion    Ankle plantarflexion    Ankle inversion    Ankle eversion     (Blank rows = not tested, N = WNL, * = concordant pain with testing)  Functional Tests  Eval  PATIENT SURVEYS:  LEFS: 73/80   TODAY'S TREATMENT:  [facility] Adult PT Treatment  08/07/2024:  Therapeutic Exercise:  Modified thomas - edge of table Prone hip ext with pillow under hips - 2x10 ea Cat camel - 20x Childs pose to cobra - 10x Plank  - 20'' - 30''  Therapeutic Activity  Bridge on ball Alternating SL - 10x ea Rear foot elevated split squat  [facility] Adult PT Treatment  08/05/2024:  Therapeutic Exercise:  Modified thomas - edge of table Hip flexor stretch in chair Prone hip ext with pillow under hips - 2x10 ea Lunge sliders- 3 way    HOME EXERCISE PROGRAM: Access Code: [email or web address] URL: [email or web address] Date: 07/20/2024 Prepared by: Helene Gasmen  Exercises - Hip Flexor Stretch at [facility] of Bed  - 1 x daily - 7 x weekly - 1 sets - 3 reps - 45  second hold - Seated Piriformis Stretch with Trunk Bend  - 1 x daily - 7 x weekly - 1 sets - 3 reps - 45 sec hold - Hip Flexor Stretch on Step  - 1 x daily - 7 x weekly - 1 sets - 3 reps - 45 sec hold  Treatment priorities   Eval        Hip flexor and RF stretching        Review gym programming        Hip hinge/DL progression        L hip ER                  ASSESSMENT:  CLINICAL IMPRESSION: Wm tolerated session well with no adverse reaction.  Worked on anterior hip flexibility combined with glute strength and active hip ext.  Updated HEP.   OBJECTIVE IMPAIRMENTS: Pain, hip ROM, RF tightness  ACTIVITY LIMITATIONS: running, riding horses, weight lifting  PERSONAL FACTORS: See medical history and pertinent history   REHAB POTENTIAL: Good  CLINICAL DECISION MAKING: Evolving/moderate complexity  EVALUATION COMPLEXITY: Moderate   GOALS:   SHORT TERM GOALS: Target date: 08/17/2024   Dhillon will be >75% HEP compliant to improve carryover between sessions and facilitate independent management of condition  Evaluation: ongoing Goal status: INITIAL   LONG TERM GOALS: Target date: 09/14/2024   Olegario will self report >/= 50% decrease in pain from evaluation to improve function in daily tasks  Evaluation/Baseline: 7/10 max pain Goal status: INITIAL   2.  Gust will show a >/= 7 pt improvement in LEFS score (MCID is ~11% or 9 pts) as a proxy for functional improvement   Evaluation/Baseline: 73 pts Goal status: INITIAL   3.  Trapper will be able to run, not limited by pain  Evaluation/Baseline: limited Goal status: INITIAL   4.  Royalty will show hip ext to neutral with thomas test  Evaluation/Baseline: limited Goal status: INITIAL    PLAN: PT FREQUENCY: 1x/week  PT DURATION: 8 weeks  PLANNED INTERVENTIONS:  97164- PT Re-evaluation, 97110-Therapeutic exercises, 97530- Therapeutic activity, [id number]- Neuromuscular re-education, 97535- Self  Care, 02859- Manual therapy, [id number]- Gait training, [id number]- Aquatic Therapy, [phone]- Electrical stimulation (manual), [id number]- Vasopneumatic device, M403810- Traction (mechanical), [id number]- Ionotophoresis 4mg /ml Dexamethasone, Taping, Dry Needling, Joint manipulation, and Spinal manipulation.   Helene Gasmen PT, DPT 08/07/2024, 10:01 AM  I just finished a MCD eval/recert.  Name: Glyndon Tursi  MRN: [id number] Please request 1x/week for 8 weeks.  Check all conditions that are expected to impact treatment: Musculoskeletal disorders   I DID put a charge in.  Check all possible CPT  codes: 02889- Therapeutic Exercise, [phone]- Neuro Re-education, [phone] - Gait Training, [phone] - Manual Therapy, [phone] - Therapeutic Activities, [phone] - Self Care, [phone] - Re-evaluation, [id number] - Mechanical traction, and [id number] - Aquatic therapy   Thank you!  MCD - Secure

## 2024-08-11 ENCOUNTER — Ambulatory Visit: Admitting: Physical Therapy

## 2024-08-11 ENCOUNTER — Telehealth: Payer: Self-pay | Admitting: Physical Therapy

## 2024-08-11 NOTE — Telephone Encounter (Signed)
Called and informed patient of missed visit and provided reminder of next appt and attendance policy.  

## 2024-08-13 ENCOUNTER — Ambulatory Visit: Admitting: Physical Therapy

## 2024-08-19 ENCOUNTER — Encounter: Payer: Self-pay | Admitting: Physical Therapy

## 2024-08-19 ENCOUNTER — Ambulatory Visit: Admitting: Physical Therapy

## 2024-08-19 DIAGNOSIS — M5459 Other low back pain: Secondary | ICD-10-CM

## 2024-08-19 DIAGNOSIS — M25551 Pain in right hip: Secondary | ICD-10-CM

## 2024-08-19 NOTE — Therapy (Signed)
 OUTPATIENT PHYSICAL THERAPY DAILY NOTE  Patient Name: Jason Fuller MRN: [id number] DOB:Dec 22, 2000, 23 y.o., male Today's Date: 08/19/2024   PT End of Session - 08/19/24 1018     Visit Number 4    Date for Recertification  09/15/24    Authorization Type [employer] M[employer]    Authorization Time Period approved 10 PT visits from 07/20/24-09/18/24    Authorization - Visit Number 4    Authorization - Number of Visits 10    PT Start Time 1017    PT Stop Time 1057    PT Time Calculation (min) 40 min          Past Medical History:  Diagnosis Date   Asthma    Attention deficit disorder    Mood disorder    Past Surgical History:  Procedure Laterality Date   ADENOIDECTOMY     TONSILLECTOMY     Patient Active Problem List   Diagnosis Date Noted   Attention deficit disorder     PCP: Orpha Asberry RAMAN, MD  REFERRING PROVIDER: Orpha Asberry RAMAN, MD  THERAPY DIAG:  Other low back pain  Pain of both hip joints  REFERRING DIAG: Hip tightness [R29.898]   Rationale for Evaluation and Treatment:  Rehabilitation  SUBJECTIVE:  PERTINENT PAST HISTORY:  ADHD        PRECAUTIONS: None  WEIGHT BEARING RESTRICTIONS No  FALLS:  Has patient fallen in last 6 months? Yes, Number of falls: 2 falls from horses  MOI/History of condition:  Onset date: my whole life  SUBJECTIVE STATEMENT  08/19/2024:  Pt reports that   EVAL:  Pt is a 23 y.o. male who presents to clinic with chief complaint of hip tightness which he feels has been going on his entire life.  He feels that this leads to back pian and other issues.  He has not tried many stretches.  Lifts weights regularly.  Pain:  Are you having pain? Yes Pain location: low back NPRS scale:  Best: 0/10, Worst: 7/10 Aggravating factors: running Relieving factors: rest Pain description: sharp and aching  Occupation: DJ  Education administrator: NA  Hand Dominance: NA  Patient Goals/Specific Activities: reduce low back pain and  get better hip flexibility    OBJECTIVE:   LUMBAR AROM  AROM AROM  (Eval)  Flexion Fingertips to mid shin  Extension limited by 75%  Right lateral flexion WNL  Left lateral flexion WNL  Right rotation limited by 25%  Left rotation limited by 25%    (Blank rows = not tested)    SENSATION: Light touch: Appears intact  PALPATION: No TTP  MUSCLE LENGTH: Hamstrings: Right no restriction; Left no restriction Hip flexors: Right significant restriction; Left significant restriction Rec Fem: Right significant restriction; Left significant restriction  LE ROM:  ROM Right (Eval) Left (Eval)  Hip flexion    Hip extension    Hip abduction    Hip adduction    Hip internal rotation 20 degrees 20 degrees  Hip external rotation 50 degrees 40 degrees  Knee extension    Knee flexion    Ankle dorsiflexion    Ankle plantarflexion    Ankle inversion    Ankle eversion     (Blank rows = not tested, N = WNL, * = concordant pain with testing)  Functional Tests  Eval  PATIENT SURVEYS:  LEFS: 73/80   TODAY'S TREATMENT:  [facility] Adult PT Treatment  08/19/2024:  Therapeutic Exercise:  Standing hip flexor stretch - 45'' x2 Couch stretch - 45'' x2 ea PPT W/ belt  Therapeutic Activity  Sumo DL Discussion on load management and consistency in stretching  [facility] Adult PT Treatment  08/05/2024:  Therapeutic Exercise:  Modified thomas - edge of table Hip flexor stretch in chair Prone hip ext with pillow under hips - 2x10 ea Lunge sliders- 3 way    HOME EXERCISE PROGRAM: Access Code: [email or web address] URL: [email or web address] Date: 07/20/2024 Prepared by: Helene Gasmen  Exercises - Hip Flexor Stretch at [facility] of Bed  - 1 x daily - 7 x weekly - 1 sets - 3 reps - 45 second hold - Seated Piriformis Stretch with Trunk Bend  - 1 x daily - 7 x weekly - 1 sets - 3 reps - 45 sec hold - Hip  Flexor Stretch on Step  - 1 x daily - 7 x weekly - 1 sets - 3 reps - 45 sec hold  Treatment priorities   Eval        Hip flexor and RF stretching        Review gym programming        Hip hinge/DL progression        L hip ER                  ASSESSMENT:  CLINICAL IMPRESSION: Jason Fuller tolerated session well with no adverse reaction.  Continue working on hip flexor stretching combined with PPT.  Pt does report PPT is relieving.  Reviewed DL form and encouraged him to move to a sumo D/L and work on reducing excessive ext, particularly at the top of the lift.  OBJECTIVE IMPAIRMENTS: Pain, hip ROM, RF tightness  ACTIVITY LIMITATIONS: running, riding horses, weight lifting  PERSONAL FACTORS: See medical history and pertinent history   REHAB POTENTIAL: Good  CLINICAL DECISION MAKING: Evolving/moderate complexity  EVALUATION COMPLEXITY: Moderate   GOALS:   SHORT TERM GOALS: Target date: 08/17/2024   Jason Fuller will be >75% HEP compliant to improve carryover between sessions and facilitate independent management of condition  Evaluation: ongoing Goal status: INITIAL   LONG TERM GOALS: Target date: 09/14/2024   Jason Fuller will self report >/= 50% decrease in pain from evaluation to improve function in daily tasks  Evaluation/Baseline: 7/10 max pain Goal status: INITIAL   2.  Jason Fuller will show a >/= 7 pt improvement in LEFS score (MCID is ~11% or 9 pts) as a proxy for functional improvement   Evaluation/Baseline: 73 pts Goal status: INITIAL   3.  Jason Fuller will be able to run, not limited by pain  Evaluation/Baseline: limited Goal status: INITIAL   4.  Jason Fuller will show hip ext to neutral with thomas test  Evaluation/Baseline: limited Goal status: INITIAL    PLAN: PT FREQUENCY: 1x/week  PT DURATION: 8 weeks  PLANNED INTERVENTIONS:  97164- PT Re-evaluation, 97110-Therapeutic exercises, 97530- Therapeutic activity, [id number]- Neuromuscular re-education, 97535-  Self Care, 02859- Manual therapy, [id number]- Gait training, [id number]- Aquatic Therapy, [phone]- Electrical stimulation (manual), [id number]- Vasopneumatic device, [id number]- Traction (mechanical), [id number]- Ionotophoresis 4mg /ml Dexamethasone, Taping, Dry Needling, Joint manipulation, and Spinal manipulation.   Helene Gasmen PT, DPT 08/19/2024, 10:57 AM  I just finished a MCD eval/recert.  Name: Kaimani Clayson  MRN: [id number] Please request 1x/week for 8 weeks.  Check all conditions that are expected to impact treatment: Musculoskeletal disorders   I DID put a charge  in.  Check all possible CPT codes: 02889- Therapeutic Exercise, [phone]- Neuro Re-education, [phone] - Gait Training, [phone] - Manual Therapy, 97530 - Therapeutic Activities, 97535 - Self Care, [phone] - Re-evaluation, [id number] - Mechanical traction, and [phone] - Aquatic therapy   Thank you!  MCD - Secure

## 2024-08-21 ENCOUNTER — Ambulatory Visit: Admitting: Physical Therapy

## 2024-08-21 ENCOUNTER — Telehealth: Payer: Self-pay | Admitting: Physical Therapy

## 2024-08-21 NOTE — Telephone Encounter (Signed)
 Called and informed patient of missed visit via VM.  2nd missed visit in 4 completed sessions.  Recommended continuing with HEP for now and following up as needed.  Can schedule 1 visit at a time if desired.

## 2024-11-02 ENCOUNTER — Encounter (HOSPITAL_COMMUNITY): Payer: Self-pay

## 2024-11-02 ENCOUNTER — Other Ambulatory Visit: Payer: Self-pay

## 2024-11-02 ENCOUNTER — Emergency Department (HOSPITAL_COMMUNITY)

## 2024-11-02 ENCOUNTER — Emergency Department (HOSPITAL_COMMUNITY)
Admission: EM | Admit: 2024-11-02 | Discharge: 2024-11-03 | Attending: Emergency Medicine | Admitting: Emergency Medicine

## 2024-11-02 DIAGNOSIS — Z5321 Procedure and treatment not carried out due to patient leaving prior to being seen by health care provider: Secondary | ICD-10-CM | POA: Insufficient documentation

## 2024-11-02 DIAGNOSIS — R0602 Shortness of breath: Secondary | ICD-10-CM | POA: Insufficient documentation

## 2024-11-02 DIAGNOSIS — R0789 Other chest pain: Secondary | ICD-10-CM | POA: Diagnosis present

## 2024-11-02 LAB — CBC
HCT: 44.8 % (ref 39.0–52.0)
Hemoglobin: 15.1 g/dL (ref 13.0–17.0)
MCH: 29.4 pg (ref 26.0–34.0)
MCHC: 33.7 g/dL (ref 30.0–36.0)
MCV: 87.2 fL (ref 80.0–100.0)
Platelets: 253 K/uL (ref 150–400)
RBC: 5.14 MIL/uL (ref 4.22–5.81)
RDW: 12 % (ref 11.5–15.5)
WBC: 7.8 K/uL (ref 4.0–10.5)
nRBC: 0 % (ref 0.0–0.2)

## 2024-11-02 MED ORDER — KETOROLAC TROMETHAMINE 30 MG/ML IJ SOLN
30.0000 mg | Freq: Once | INTRAMUSCULAR | Status: AC
  Administered 2024-11-03: 30 mg via INTRAMUSCULAR
  Filled 2024-11-02: qty 1

## 2024-11-02 NOTE — ED Provider Triage Note (Signed)
 Emergency Medicine Provider Triage Evaluation Note  [facility] , a 23 y.o. male  was evaluated in triage.  Pt complains of CP on the left for the last 24 hours. He was working out on Saturday with he felt pain/soreness in left chest. This pain has worsened and is now sharp and causing SOB.   Review of Systems  Positive: CP and SOB Negative: Abdominal pain  Physical Exam  BP (!) 141/77   Pulse 61   Temp 98.4 F (36.9 C) (Oral)   Resp 15   SpO2 98%  Gen:   Awake, no distress   Resp:  Normal effort  MSK:   Moves extremities without difficulty  Other:  No tenderness to palpation of the left chest  Medical Decision Making  Medically screening exam initiated at 11:52 PM.  Appropriate orders placed.  Jason Fuller was informed that the remainder of the evaluation will be completed by another provider, this initial triage assessment does not replace that evaluation, and the importance of remaining in the ED until their evaluation is complete.    Darra Fonda MATSU, MD 11/02/24 [phone]

## 2024-11-02 NOTE — ED Triage Notes (Signed)
 Pt states that he has left sided chest pain x 1 day that worsens with certain movements and deep breathing. Pt reports that he was working out really hard this weekend and wonders if he pulled a muscle.

## 2024-11-03 LAB — BASIC METABOLIC PANEL WITH GFR
Anion gap: 10 (ref 5–15)
BUN: 13 mg/dL (ref 6–20)
CO2: 26 mmol/L (ref 22–32)
Calcium: 9.6 mg/dL (ref 8.9–10.3)
Chloride: 104 mmol/L (ref 98–111)
Creatinine, Ser: 1.04 mg/dL (ref 0.61–1.24)
GFR, Estimated: >60 mL/min
Glucose, Bld: 115 mg/dL — ABNORMAL HIGH (ref 70–99)
Potassium: 3.7 mmol/L (ref 3.5–5.1)
Sodium: 139 mmol/L (ref 135–145)

## 2024-11-03 LAB — D-DIMER, QUANTITATIVE: D-Dimer, Quant: <0.27 ug{FEU}/mL (ref 0.00–0.50)

## 2024-11-03 LAB — TROPONIN T, HIGH SENSITIVITY: Troponin T High Sensitivity: <15 ng/L (ref 0–19)

## 2024-11-03 NOTE — ED Notes (Signed)
Patient has left. °
